# Patient Record
Sex: Male | Born: 1990 | Race: Black or African American | Hispanic: No | Marital: Married | Smoking: Never smoker
Health system: Southern US, Community
[De-identification: ages and names within clinical notes are randomized; demographics above are authoritative.]

---
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
## 2019-10-15 ENCOUNTER — Emergency Department (HOSPITAL_COMMUNITY)
Admission: EM | Admit: 2019-10-15 | Discharge: 2019-10-15 | Disposition: A | Discharge status: Home / Self Care | Payer: BC Managed Care – PPO | Attending: Emergency Medicine | Admitting: Emergency Medicine

## 2019-10-15 ENCOUNTER — Encounter (HOSPITAL_COMMUNITY): Payer: Self-pay | Admitting: *Deleted

## 2019-10-15 ENCOUNTER — Other Ambulatory Visit: Payer: Self-pay

## 2019-10-15 DIAGNOSIS — R0981 Nasal congestion: Secondary | ICD-10-CM | POA: Diagnosis not present

## 2019-10-15 DIAGNOSIS — J029 Acute pharyngitis, unspecified: Secondary | ICD-10-CM | POA: Insufficient documentation

## 2019-10-15 DIAGNOSIS — K219 Gastro-esophageal reflux disease without esophagitis: Secondary | ICD-10-CM | POA: Insufficient documentation

## 2019-10-15 LAB — GROUP A STREP BY PCR: Group A Strep by PCR: NOT DETECTED

## 2019-10-15 MED ORDER — PANTOPRAZOLE SODIUM 40 MG PO TBEC: 40.0000 mg | DELAYED_RELEASE_TABLET | Freq: Every day | ORAL | 0 refills | Status: DC

## 2019-10-15 MED ORDER — LIDOCAINE VISCOUS HCL 2 % MT SOLN
15.0000 mL | Freq: Once | OROMUCOSAL | Status: AC
  Administered 2019-10-15: 15 mL via ORAL
  Filled 2019-10-15: qty 15

## 2019-10-15 MED ORDER — ALUM & MAG HYDROXIDE-SIMETH 200-200-20 MG/5ML PO SUSP
30.0000 mL | Freq: Once | ORAL | Status: AC
  Administered 2019-10-15: 30 mL via ORAL
  Filled 2019-10-15: qty 30

## 2019-10-15 NOTE — Discharge Instructions (Addendum)
Your strep test today was negative.  Your throat symptoms may likely be related to acid reflux.  Avoid greasy spicy foods and alcohol.  Take the medication as directed every day.  I have listed a primary care provider for you to arrange follow-up with if needed.

## 2019-10-15 NOTE — ED Triage Notes (Signed)
Pt with sore throat x 4 days. Hurts to swallow per pt.

## 2019-10-15 NOTE — ED Provider Notes (Signed)
[facility] EMERGENCY DEPARTMENT Provider Note   CSN: [id number] Arrival date & time: 10/15/19  1337     History Chief Complaint  Patient presents with  . Sore Throat    Hunter Bell is a 29 y.o. male.  HPI      Hunter Bell is a 29 y.o. male who presents to the Emergency Department complaining of sore throat pain for 4 days.  Pain is associated with swallowing  and worse upon waking.  He describes a sharp pain to his throat when he attempts to swallow even when swallowing his saliva.  States he has been unable to eat solid foods due to the pain. Tolerating fluids.  He states he has had this symptoms before when he has had a "sinus infection" but pain is persisted longer than usual.  He denies any nasal congestion, facial pain or pressure, fever or chills cough or shortness of breath.  Denies known Covid exposures or recent sick contacts.   History reviewed. No pertinent past medical history.  There are no problems to display for this patient.   History reviewed. No pertinent surgical history.     History reviewed. No pertinent family history.  Social History   Tobacco Use  . Smoking status: Never Smoker  . Smokeless tobacco: Never Used  Substance Use Topics  . Alcohol use: Yes    Comment: occ. use  . Drug use: Never    Home Medications Prior to Admission medications   Not on File    Allergies    Patient has no known allergies.  Review of Systems   Review of Systems  Constitutional: Negative for activity change, appetite change, chills and fever.  HENT: Positive for congestion and sore throat. Negative for ear pain, facial swelling, rhinorrhea, sinus pressure, sinus pain, trouble swallowing and voice change.   Eyes: Negative for pain and visual disturbance.  Respiratory: Negative for cough and shortness of breath.   Cardiovascular: Negative for chest pain.  Gastrointestinal: Negative for abdominal pain, diarrhea, nausea and vomiting.  Musculoskeletal:  Negative for arthralgias, neck pain and neck stiffness.  Skin: Negative for color change and rash.  Neurological: Negative for dizziness, facial asymmetry, speech difficulty, numbness and headaches.  Hematological: Negative for adenopathy.    Physical Exam Updated Vital Signs BP (!) 147/95 (BP Location: Right Arm)   Pulse 97   Temp 99.1 F (37.3 C) (Oral)   Resp 14   Ht 6\' 1"  (1.854 m)   Wt 106.6 kg   SpO2 95%   BMI 31.00 kg/m   Physical Exam Vitals and nursing note reviewed.  Constitutional:      Appearance: He is well-developed. He is not ill-appearing or toxic-appearing.  HENT:     Right Ear: Tympanic membrane and ear canal normal.     Left Ear: Tympanic membrane and ear canal normal.     Mouth/Throat:     Mouth: Mucous membranes are moist. No oral lesions.     Pharynx: Oropharynx is clear. Uvula midline. No pharyngeal swelling, oropharyngeal exudate, posterior oropharyngeal erythema or uvula swelling.     Comments: No erythema or edema of the oropharynx.  Uvula is midline and nonedematous.  No oral lesions.  No tonsillar exudates.  No peritonsillar abscess. Cardiovascular:     Rate and Rhythm: Normal rate and regular rhythm.  Pulmonary:     Effort: Pulmonary effort is normal.     Breath sounds: Normal breath sounds.  Abdominal:     General: There is no distension.  Palpations: Abdomen is soft.     Tenderness: There is no abdominal tenderness.  Musculoskeletal:     Cervical back: Normal range of motion.  Lymphadenopathy:     Cervical: No cervical adenopathy.  Skin:    General: Skin is warm.     Capillary Refill: Capillary refill takes less than 2 seconds.  Neurological:     Mental Status: He is alert.     ED Results / Procedures / Treatments   Labs (all labs ordered are listed, but only abnormal results are displayed) Labs Reviewed  GROUP A STREP BY PCR    EKG None  Radiology No results found.  Procedures Procedures (including critical care  time)  Medications Ordered in ED Medications  alum & mag hydroxide-simeth (MAALOX/MYLANTA) 200-200-20 MG/5ML suspension 30 mL (has no administration in time range)    And  lidocaine (XYLOCAINE) 2 % viscous mouth solution 15 mL (has no administration in time range)    ED Course  I have reviewed the triage vital signs and the nursing notes.  Pertinent labs & imaging results that were available during my care of the patient were reviewed by me and considered in my medical decision making (see chart for details).    MDM Rules/Calculators/A&P                          Patient here with 4-day history of sore throat.  Describes a sensation of swallowing needles.  Airways patent.  Oropharynx is normal-appearing.  No tonsillar exudates.  He is handling secretions well and no hot potato voice.  Strep PCR is negative.  No concerning symptoms for peritonsillar or retropharyngeal abscess.  I feel that symptoms are likely related to acid reflux.  I will start him on a PPI, he agrees to close outpatient follow-up.   Final Clinical Impression(s) / ED Diagnoses Final diagnoses:  Sore throat  Gastroesophageal reflux disease without esophagitis    Rx / DC Orders ED Discharge Orders    None       Pauline Aus, PA-C 10/15/19 1559    Bethann Berkshire, MD 10/16/19 [phone]

## 2020-06-24 ENCOUNTER — Encounter (HOSPITAL_COMMUNITY): Payer: Self-pay | Admitting: Emergency Medicine

## 2020-06-24 ENCOUNTER — Other Ambulatory Visit: Payer: Self-pay

## 2020-06-24 DIAGNOSIS — R7401 Elevation of levels of liver transaminase levels: Secondary | ICD-10-CM | POA: Insufficient documentation

## 2020-06-24 DIAGNOSIS — R202 Paresthesia of skin: Secondary | ICD-10-CM | POA: Insufficient documentation

## 2020-06-24 NOTE — ED Triage Notes (Signed)
Pt to the ED with pins and needle feeling in his left arm, legs and chest for the past 7 days. Pt states it is starting to affect his right arm today.

## 2020-06-25 ENCOUNTER — Emergency Department (HOSPITAL_COMMUNITY): Payer: Self-pay

## 2020-06-25 ENCOUNTER — Encounter (HOSPITAL_COMMUNITY): Payer: Self-pay | Admitting: Radiology

## 2020-06-25 ENCOUNTER — Emergency Department (HOSPITAL_COMMUNITY)
Admission: EM | Admit: 2020-06-25 | Discharge: 2020-06-25 | Disposition: A | Discharge status: Home / Self Care | Payer: Self-pay | Attending: Emergency Medicine | Admitting: Emergency Medicine

## 2020-06-25 DIAGNOSIS — R7401 Elevation of levels of liver transaminase levels: Secondary | ICD-10-CM

## 2020-06-25 DIAGNOSIS — R202 Paresthesia of skin: Secondary | ICD-10-CM

## 2020-06-25 LAB — CBC WITH DIFFERENTIAL/PLATELET
Abs Immature Granulocytes: 0.02 10*3/uL (ref 0.00–0.07)
Basophils Absolute: 0.1 10*3/uL (ref 0.0–0.1)
Basophils Relative: 1 %
Eosinophils Absolute: 0.2 10*3/uL (ref 0.0–0.5)
Eosinophils Relative: 2 %
HCT: 49.5 % (ref 39.0–52.0)
Hemoglobin: 16.7 g/dL (ref 13.0–17.0)
Immature Granulocytes: 0 %
Lymphocytes Relative: 26 %
Lymphs Abs: 2.5 10*3/uL (ref 0.7–4.0)
MCH: 30.7 pg (ref 26.0–34.0)
MCHC: 33.7 g/dL (ref 30.0–36.0)
MCV: 91 fL (ref 80.0–100.0)
Monocytes Absolute: 1.1 10*3/uL — ABNORMAL HIGH (ref 0.1–1.0)
Monocytes Relative: 11 %
Neutro Abs: 5.8 10*3/uL (ref 1.7–7.7)
Neutrophils Relative %: 60 %
Platelets: 277 10*3/uL (ref 150–400)
RBC: 5.44 MIL/uL (ref 4.22–5.81)
RDW: 13.3 % (ref 11.5–15.5)
WBC: 9.6 10*3/uL (ref 4.0–10.5)
nRBC: 0 % (ref 0.0–0.2)

## 2020-06-25 LAB — SEDIMENTATION RATE: Sed Rate: 2 mm/hr (ref 0–16)

## 2020-06-25 LAB — COMPREHENSIVE METABOLIC PANEL
ALT: 66 U/L — ABNORMAL HIGH (ref 0–44)
AST: 34 U/L (ref 15–41)
Albumin: 4.2 g/dL (ref 3.5–5.0)
Alkaline Phosphatase: 75 U/L (ref 38–126)
Anion gap: 9 (ref 5–15)
BUN: 14 mg/dL (ref 6–20)
CO2: 23 mmol/L (ref 22–32)
Calcium: 9.2 mg/dL (ref 8.9–10.3)
Chloride: 105 mmol/L (ref 98–111)
Creatinine, Ser: 1.04 mg/dL (ref 0.61–1.24)
GFR, Estimated: >60 mL/min (ref >60)
Glucose, Bld: 104 mg/dL — ABNORMAL HIGH (ref 70–99)
Potassium: 3.8 mmol/L (ref 3.5–5.1)
Sodium: 137 mmol/L (ref 135–145)
Total Bilirubin: 0.4 mg/dL (ref 0.3–1.2)
Total Protein: 7.9 g/dL (ref 6.5–8.1)

## 2020-06-25 IMAGING — CT CT HEAD W/O CM
3 series · 16 of 47 positions shown, 19 images · non-contrast
Comparison: None.

CLINICAL DATA: New onset left-sided numbness

EXAM:
CT HEAD WITHOUT CONTRAST
TECHNIQUE: Contiguous axial images were obtained from the base of the skull
through the vertex without intravenous contrast.

[Series 2: head w o · axial · 0.43mm/px · z∈[+1345,+1475]mm · 10 of 32 slices shown, 13 images]
[im 3/32  brain]
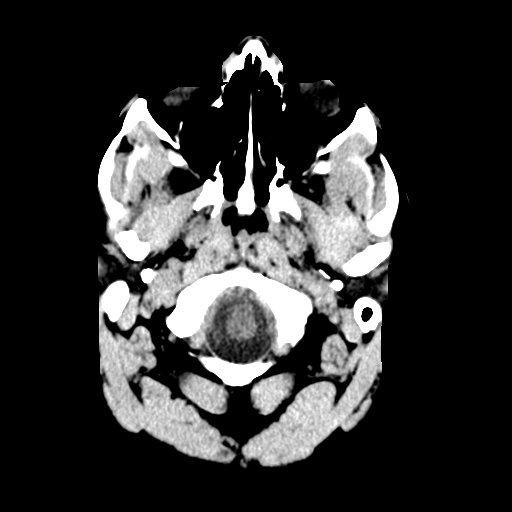
[im 3/32  bone]
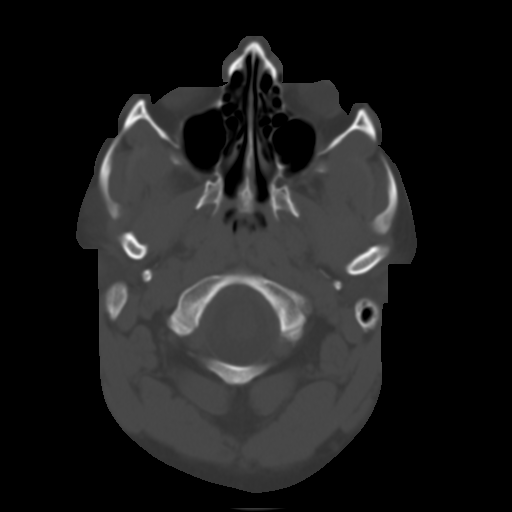
[im 6/32  brain]
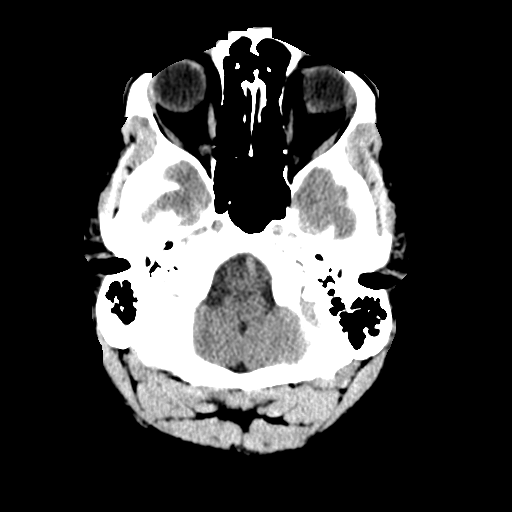
[im 9/32  brain]
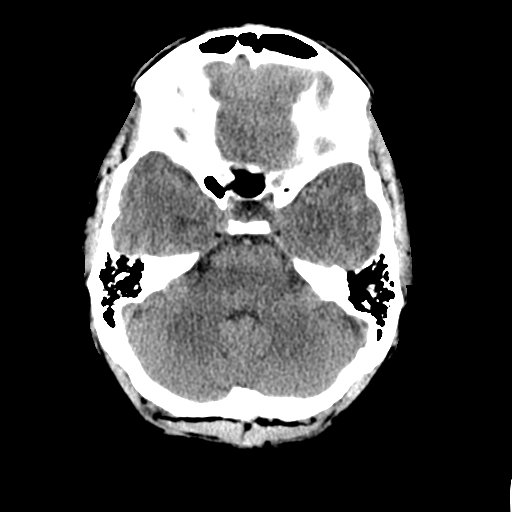
[im 11/32  brain]
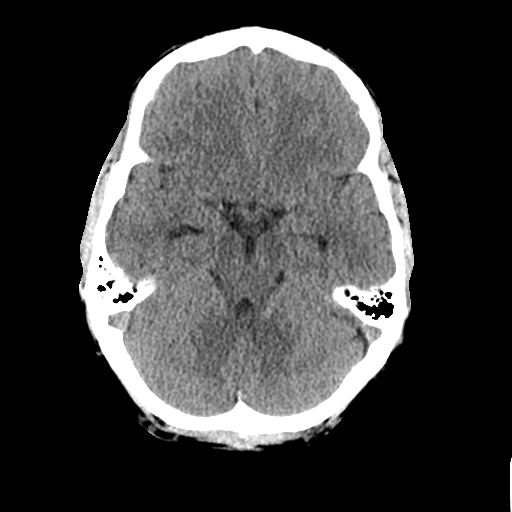
[im 14/32  brain]
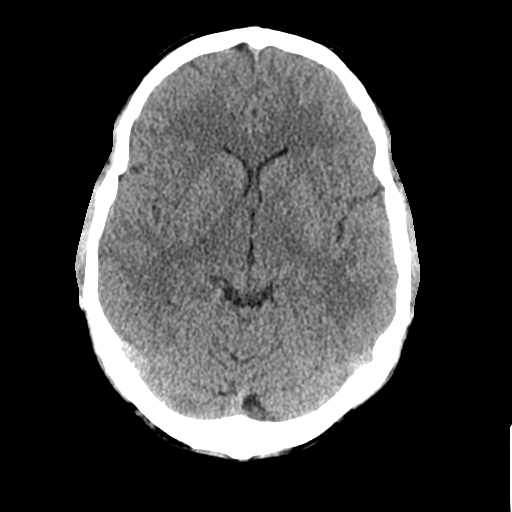
[im 14/32  bone]
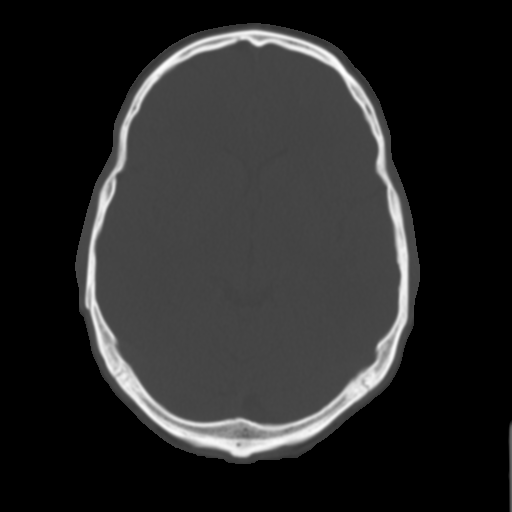
[im 18/32  brain]
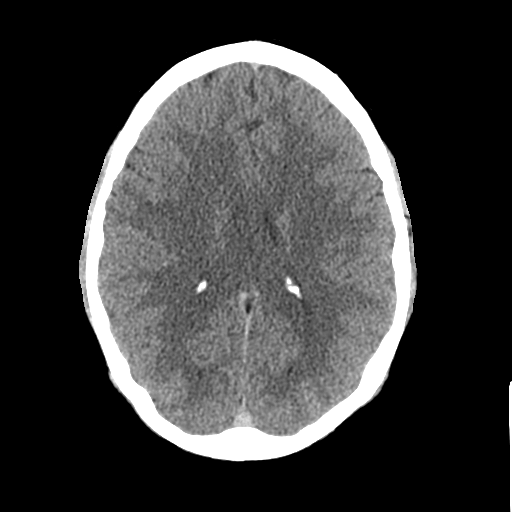
[im 21/32  brain]
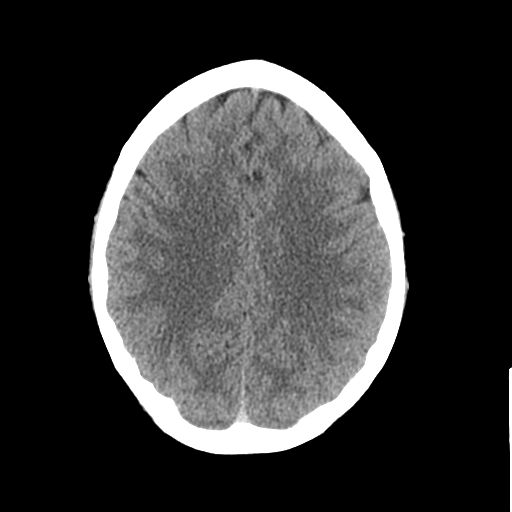
[im 24/32  brain]
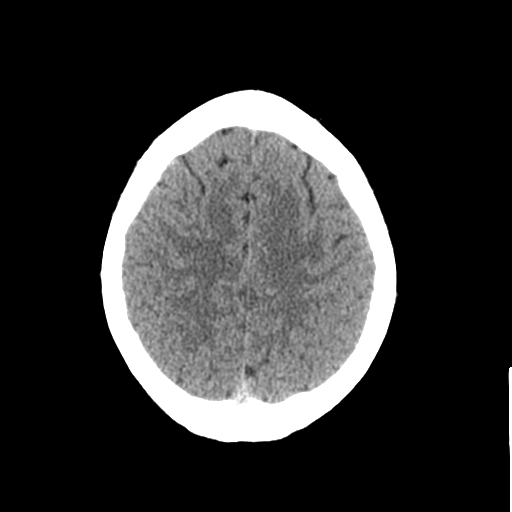
[im 26/32  brain]
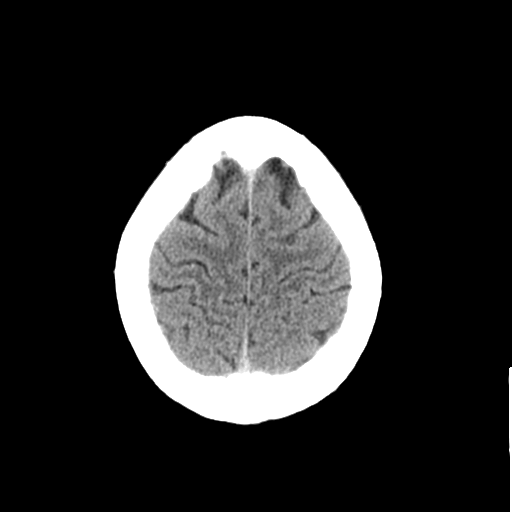
[im 26/32  bone]
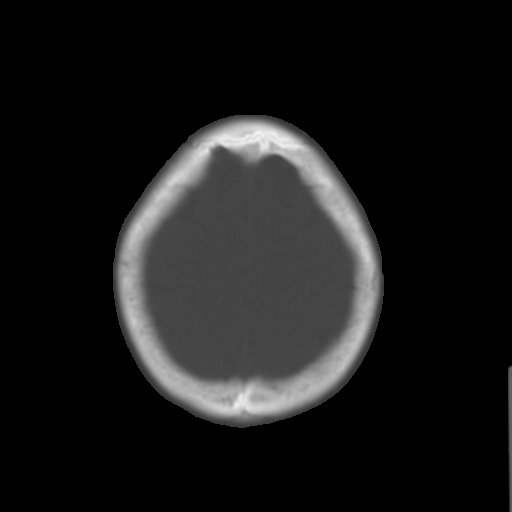
[im 29/32  brain]
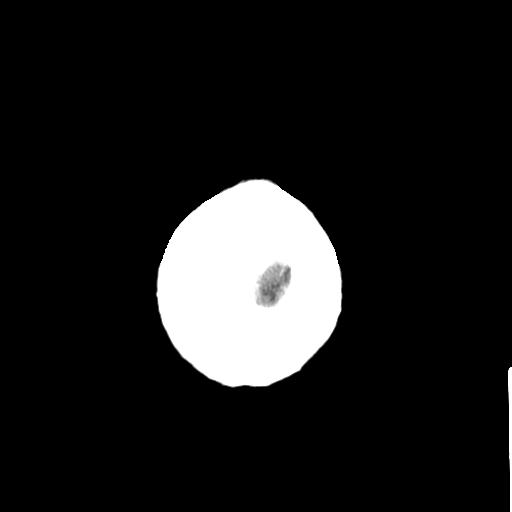

[Series 4: coronal soft · coronal · 0.32mm/px · 3 of 67 slices shown]
[im 23/67  brain]
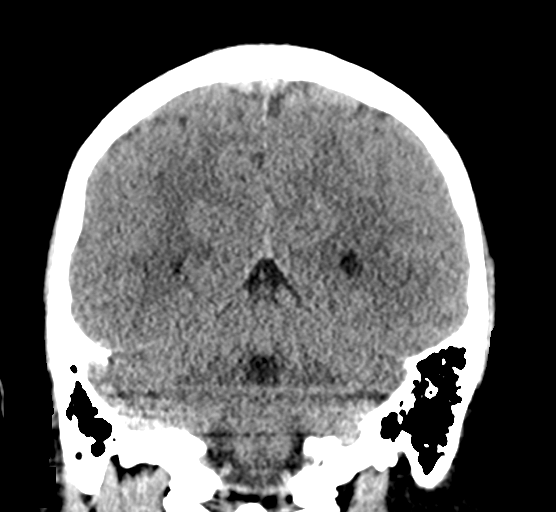
[im 30/67  brain]
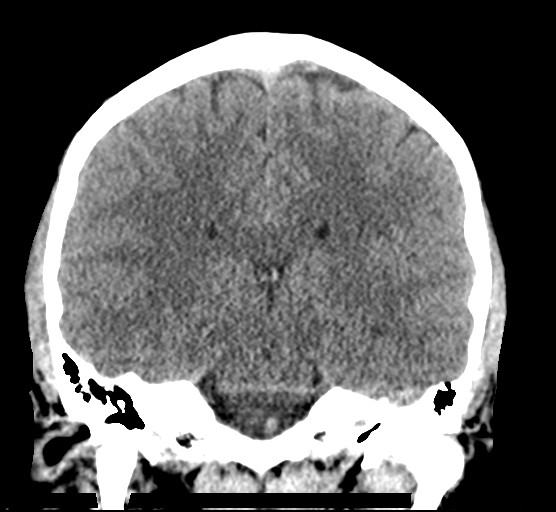
[im 37/67  brain]
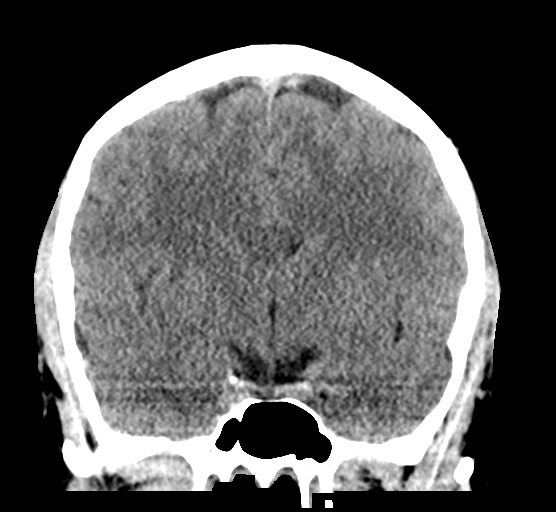

[Series 5: sagittal soft · sagittal · 0.33mm/px · 3 of 57 slices shown]
[im 19/57  brain]
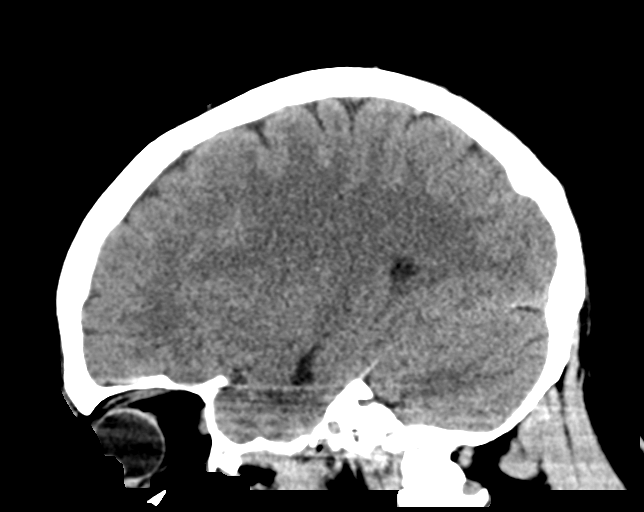
[im 29/57  brain]
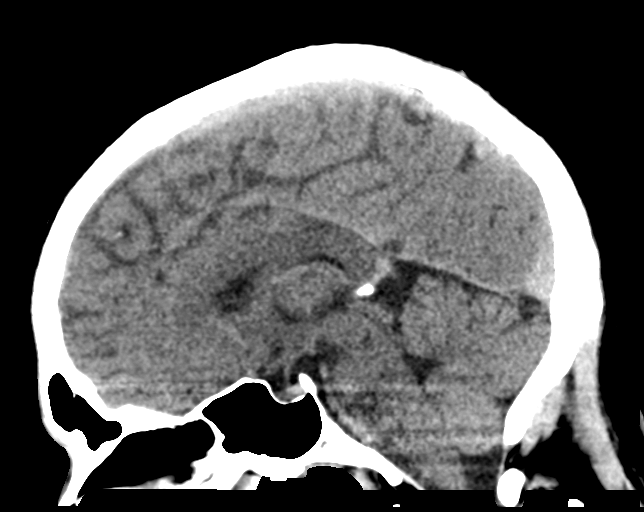
[im 38/57  brain]
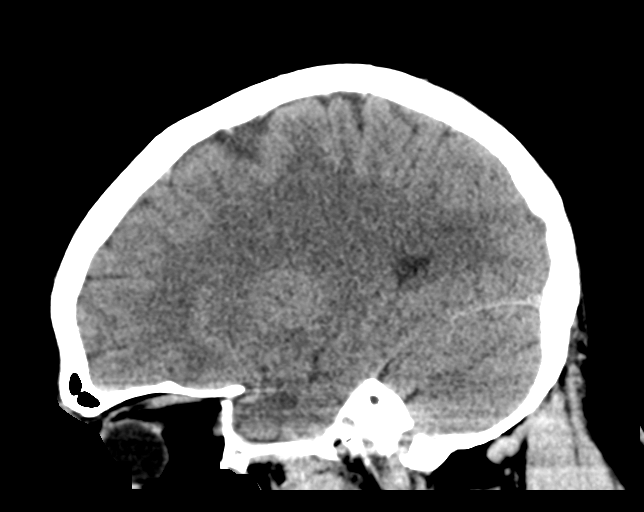

[16 of 47 positions shown; findings below may reference images not displayed]

FINDINGS: Brain: There is no mass, hemorrhage or extra-axial collection. The
size and configuration of the ventricles and extra-axial CSF spaces
are normal. The brain parenchyma is normal, without acute or chronic
infarction.

Vascular: No abnormal hyperdensity of the major intracranial
arteries or dural venous sinuses. No intracranial atherosclerosis.

Skull: The visualized skull base, calvarium and extracranial soft
tissues are normal.

Sinuses/Orbits: No fluid levels or advanced mucosal thickening of
the visualized paranasal sinuses. No mastoid or middle ear effusion.
The orbits are normal.
IMPRESSION: Normal head CT.

## 2020-06-25 NOTE — Discharge Instructions (Addendum)
The cause for your numb feeling was not found through either of the blood tests or the CT scan which was done today.  You need to be evaluated by a neurologist, and will likely need MRI scans of your head and cervical spine.  Please call the neurologist today to get an appointment as soon as possible.  If your symptoms are getting worse, return at any time.

## 2020-06-25 NOTE — ED Provider Notes (Signed)
[facility] EMERGENCY DEPARTMENT Provider Note   CSN: [id number] Arrival date & time: 06/24/20  2142   History Chief Complaint  Patient presents with  . Extremity neuropathy    Hunter Bell is a 30 y.o. male.  The history is provided by the patient.  He comes in with approximately 10-day history of progressive paresthesias.  It started off and is left arm and then gradually spread to involve the left side of his trunk, left leg, then the right side of the trunk and then right arm and right leg.  He states it feels like his arms and legs are asleep.  He can feel that something is touching them but cannot tell what it is.  He has not noted any weakness.  There is no numbness of the face or neck.  He denies any bowel or bladder dysfunction.  He states that when he has to urinate he can tell that he has to but he cannot feel that he is actually passing the urine.  He denies fever chills.  He has not noted any difficulty with coordination.  He drinks occasionally, denies illicit drug use.  History reviewed. No pertinent past medical history.  There are no problems to display for this patient.   History reviewed. No pertinent surgical history.     History reviewed. No pertinent family history.  Social History   Tobacco Use  . Smoking status: Never Smoker  . Smokeless tobacco: Never Used  Vaping Use  . Vaping Use: Never used  Substance Use Topics  . Alcohol use: Yes    Comment: occ. use  . Drug use: Never    Home Medications Prior to Admission medications   Medication Sig Start Date End Date Taking? Authorizing Provider  pantoprazole (PROTONIX) 40 MG tablet Take 1 tablet (40 mg total) by mouth daily. 10/15/19   Triplett, Tammy, PA-C    Allergies    Patient has no known allergies.  Review of Systems   Review of Systems  All other systems reviewed and are negative.   Physical Exam Updated Vital Signs BP (!) 142/79 (BP Location: Right Arm)   Pulse 74   Temp 98.1 F  (36.7 C) (Oral)   Resp 16   Ht 6\' 1"  (1.854 m)   Wt 111.1 kg   SpO2 98%   BMI 32.32 kg/m   Physical Exam Vitals and nursing note reviewed.   30 year old male, resting comfortably and in no acute distress. Vital signs are significant for borderline elevated blood pressure. Oxygen saturation is 98%, which is normal. Head is normocephalic and atraumatic. PERRLA, EOMI. Oropharynx is clear. Neck is nontender and supple without adenopathy or JVD. Back is nontender and there is no CVA tenderness. Lungs are clear without rales, wheezes, or rhonchi. Chest is nontender. Heart has regular rate and rhythm without murmur. Abdomen is soft, flat, nontender without masses or hepatosplenomegaly and peristalsis is normoactive. Extremities have no cyanosis or edema, full range of motion is present. Skin is warm and dry without rash. Neurologic: Awake and alert, speech is normal, cranial nerves are intact.  Motor strength is 5/5 in both arms and both legs.  There is no pronator drift.  Sensation is mildly decreased in the right arm, moderately decreased in the right leg, severely decreased in the left arm and left leg.  There is mild ataxia noted on finger-nose testing on the left and also with heel-to-shin on the left.  ED Results / Procedures / Treatments   Labs (  all labs ordered are listed, but only abnormal results are displayed) Labs Reviewed  COMPREHENSIVE METABOLIC PANEL - Abnormal; Notable for the following components:      Result Value   Glucose, Bld 104 (*)    ALT 66 (*)    All other components within normal limits  CBC WITH DIFFERENTIAL/PLATELET - Abnormal; Notable for the following components:   Monocytes Absolute 1.1 (*)    All other components within normal limits  SEDIMENTATION RATE   Radiology CT Head Wo Contrast  Result Date: 06/25/2020 CLINICAL DATA:  New onset left-sided numbness EXAM: CT HEAD WITHOUT CONTRAST TECHNIQUE: Contiguous axial images were obtained from the base of  the skull through the vertex without intravenous contrast. COMPARISON:  None. FINDINGS: Brain: There is no mass, hemorrhage or extra-axial collection. The size and configuration of the ventricles and extra-axial CSF spaces are normal. The brain parenchyma is normal, without acute or chronic infarction. Vascular: No abnormal hyperdensity of the major intracranial arteries or dural venous sinuses. No intracranial atherosclerosis. Skull: The visualized skull base, calvarium and extracranial soft tissues are normal. Sinuses/Orbits: No fluid levels or advanced mucosal thickening of the visualized paranasal sinuses. No mastoid or middle ear effusion. The orbits are normal. IMPRESSION: Normal head CT. Electronically Signed   By: Deatra Robinson M.D.   On: 06/25/2020 03:13    Procedures Procedures   Medications Ordered in ED Medications - No data to display  ED Course  I have reviewed the triage vital signs and the nursing notes.  Pertinent labs & imaging results that were available during my care of the patient were reviewed by me and considered in my medical decision making (see chart for details).  MDM Rules/Calculators/A&P Paresthesias which are not following any typical neurologic pattern.  This is certainly concerning for multiple sclerosis.  Will check screening labs and CT of head.  I suspect that he will need MRI scans of the head and cervical spine.  Old records are reviewed, and he has no relevant past visits.  CT is normal.  Labs are significant for mildly elevated ALT of doubtful clinical significance.  Sedimentation rate is 2.  Patient was offered the option of staying to have MRI of brain and cervical spine done here, he prefers to go home.  He is referred to neurology for outpatient work-up.  Advised to return if his symptoms are getting worse.  Final Clinical Impression(s) / ED Diagnoses Final diagnoses:  Paresthesia  Elevated ALT measurement    Rx / DC Orders ED Discharge Orders     None       Dione Booze, MD 06/25/20 [phone]

## 2020-06-26 ENCOUNTER — Observation Stay (HOSPITAL_COMMUNITY)
Admission: EM | Admit: 2020-06-26 | Discharge: 2020-06-29 | Disposition: A | Discharge status: Home / Self Care | Payer: Self-pay | Attending: Internal Medicine | Admitting: Internal Medicine

## 2020-06-26 ENCOUNTER — Other Ambulatory Visit: Payer: Self-pay

## 2020-06-26 DIAGNOSIS — G379 Demyelinating disease of central nervous system, unspecified: Secondary | ICD-10-CM

## 2020-06-26 DIAGNOSIS — M6281 Muscle weakness (generalized): Secondary | ICD-10-CM | POA: Diagnosis present

## 2020-06-26 DIAGNOSIS — R2990 Unspecified symptoms and signs involving the nervous system: Secondary | ICD-10-CM

## 2020-06-26 DIAGNOSIS — E538 Deficiency of other specified B group vitamins: Secondary | ICD-10-CM | POA: Diagnosis present

## 2020-06-26 DIAGNOSIS — Z79899 Other long term (current) drug therapy: Secondary | ICD-10-CM | POA: Insufficient documentation

## 2020-06-26 DIAGNOSIS — G373 Acute transverse myelitis in demyelinating disease of central nervous system: Secondary | ICD-10-CM

## 2020-06-26 DIAGNOSIS — E559 Vitamin D deficiency, unspecified: Secondary | ICD-10-CM | POA: Insufficient documentation

## 2020-06-26 DIAGNOSIS — R202 Paresthesia of skin: Principal | ICD-10-CM | POA: Insufficient documentation

## 2020-06-26 DIAGNOSIS — Z20822 Contact with and (suspected) exposure to covid-19: Secondary | ICD-10-CM | POA: Insufficient documentation

## 2020-06-26 NOTE — ED Provider Notes (Signed)
MSE was initiated and I personally evaluated the patient and placed orders (if any) at  9:54 PM on June 26, 2020.  The patient appears stable so that the remainder of the MSE may be completed by another provider.  Patient is a 30 year old man who is in today for evaluation of 10 days of progressive paresthesias.  He was seen last night at [facility] and at that point declined MRIs. He presents today because he states his symptoms are worsening and he is wishing for MRIs. No trauma.  No pain in his chest, neck or back.   Initiation of care has begun. The patient has been counseled on the process, plan, and necessity for staying for the completion/evaluation, and the remainder of the medical screening examination.     Norman Clay 06/26/20 2156    Gerhard Munch, MD 06/26/20 2326

## 2020-06-26 NOTE — ED Triage Notes (Signed)
Pt reports in arms and legs numbness on left side, Also numbness in private area and buttocks since last Thursday   Pt was seen yesterday at [facility] and was told to come to [facility] for an MRI. NIH= 0

## 2020-06-27 ENCOUNTER — Encounter (HOSPITAL_COMMUNITY): Payer: Self-pay | Admitting: Internal Medicine

## 2020-06-27 ENCOUNTER — Emergency Department (HOSPITAL_COMMUNITY): Payer: Self-pay

## 2020-06-27 ENCOUNTER — Other Ambulatory Visit: Payer: Self-pay

## 2020-06-27 DIAGNOSIS — G959 Disease of spinal cord, unspecified: Secondary | ICD-10-CM

## 2020-06-27 DIAGNOSIS — M6281 Muscle weakness (generalized): Secondary | ICD-10-CM | POA: Diagnosis present

## 2020-06-27 LAB — CBC WITH DIFFERENTIAL/PLATELET
Abs Immature Granulocytes: 0.03 10*3/uL (ref 0.00–0.07)
Basophils Absolute: 0.1 10*3/uL (ref 0.0–0.1)
Basophils Relative: 1 %
Eosinophils Absolute: 0.1 10*3/uL (ref 0.0–0.5)
Eosinophils Relative: 1 %
HCT: 49.9 % (ref 39.0–52.0)
Hemoglobin: 17 g/dL (ref 13.0–17.0)
Immature Granulocytes: 0 %
Lymphocytes Relative: 27 %
Lymphs Abs: 3 10*3/uL (ref 0.7–4.0)
MCH: 30.6 pg (ref 26.0–34.0)
MCHC: 34.1 g/dL (ref 30.0–36.0)
MCV: 89.9 fL (ref 80.0–100.0)
Monocytes Absolute: 0.9 10*3/uL (ref 0.1–1.0)
Monocytes Relative: 8 %
Neutro Abs: 6.7 10*3/uL (ref 1.7–7.7)
Neutrophils Relative %: 63 %
Platelets: 271 10*3/uL (ref 150–400)
RBC: 5.55 MIL/uL (ref 4.22–5.81)
RDW: 13.1 % (ref 11.5–15.5)
WBC: 10.8 10*3/uL — ABNORMAL HIGH (ref 4.0–10.5)
nRBC: 0 % (ref 0.0–0.2)

## 2020-06-27 LAB — RESP PANEL BY RT-PCR (FLU A&B, COVID) ARPGX2
Influenza A by PCR: NEGATIVE
Influenza B by PCR: NEGATIVE
SARS Coronavirus 2 by RT PCR: NEGATIVE

## 2020-06-27 LAB — BASIC METABOLIC PANEL
Anion gap: 7 (ref 5–15)
BUN: 13 mg/dL (ref 6–20)
CO2: 26 mmol/L (ref 22–32)
Calcium: 9.3 mg/dL (ref 8.9–10.3)
Chloride: 104 mmol/L (ref 98–111)
Creatinine, Ser: 1.14 mg/dL (ref 0.61–1.24)
GFR, Estimated: >60 mL/min (ref >60)
Glucose, Bld: 93 mg/dL (ref 70–99)
Potassium: 3.7 mmol/L (ref 3.5–5.1)
Sodium: 137 mmol/L (ref 135–145)

## 2020-06-27 LAB — VITAMIN B12: Vitamin B-12: 158 pg/mL — ABNORMAL LOW (ref 180–914)

## 2020-06-27 LAB — TSH: TSH: 2.646 u[IU]/mL (ref 0.350–4.500)

## 2020-06-27 LAB — RAPID HIV SCREEN (HIV 1/2 AB+AG)
HIV 1/2 Antibodies: NONREACTIVE
HIV-1 P24 Antigen - HIV24: NONREACTIVE

## 2020-06-27 IMAGING — MR MR HEAD WO/W CM
11 of 18 series · 21 of 48 positions shown · IV contrast (gadavist)
Comparison: None.

CLINICAL DATA: Multiple sclerosis suspected.

EXAM:
MRI HEAD WITHOUT AND WITH CONTRAST
TECHNIQUE: Multiplanar, multiecho pulse sequences of the brain and surrounding
structures were obtained without and with intravenous contrast.
CONTRAST:  10mL GADAVIST GADOBUTROL 1 MMOL/ML IV SOLN

[Series 2: DWI · axial · 3.6mm · 0.94mm/px · z∈[-52,+100]mm · 3 of 86 slices shown (1 of 2)]
[im 1/86]
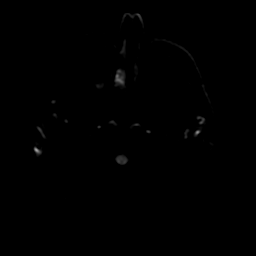
[im 43/86]
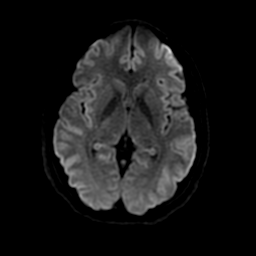
[im 86/86]
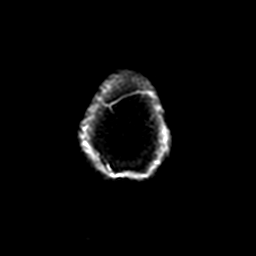

[Series 3: FLAIR · sagittal · 5.0mm · 0.47mm/px · 1 of 25 slices shown (1 of 3)]
[im 1/25]
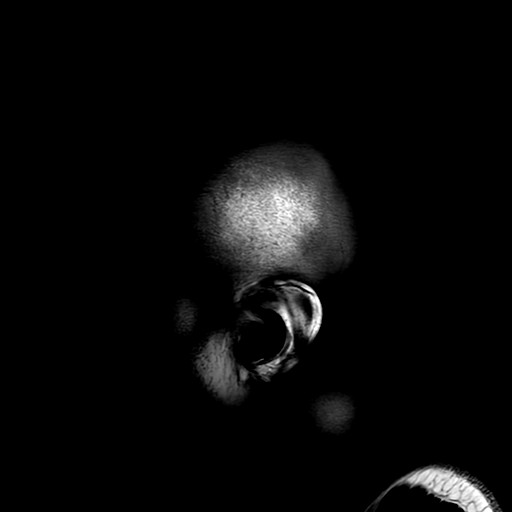

[Series 4: T2 · axial · 5.0mm · 0.23mm/px · 1 of 26 slices shown (1 of 2)]
[im 1/26]
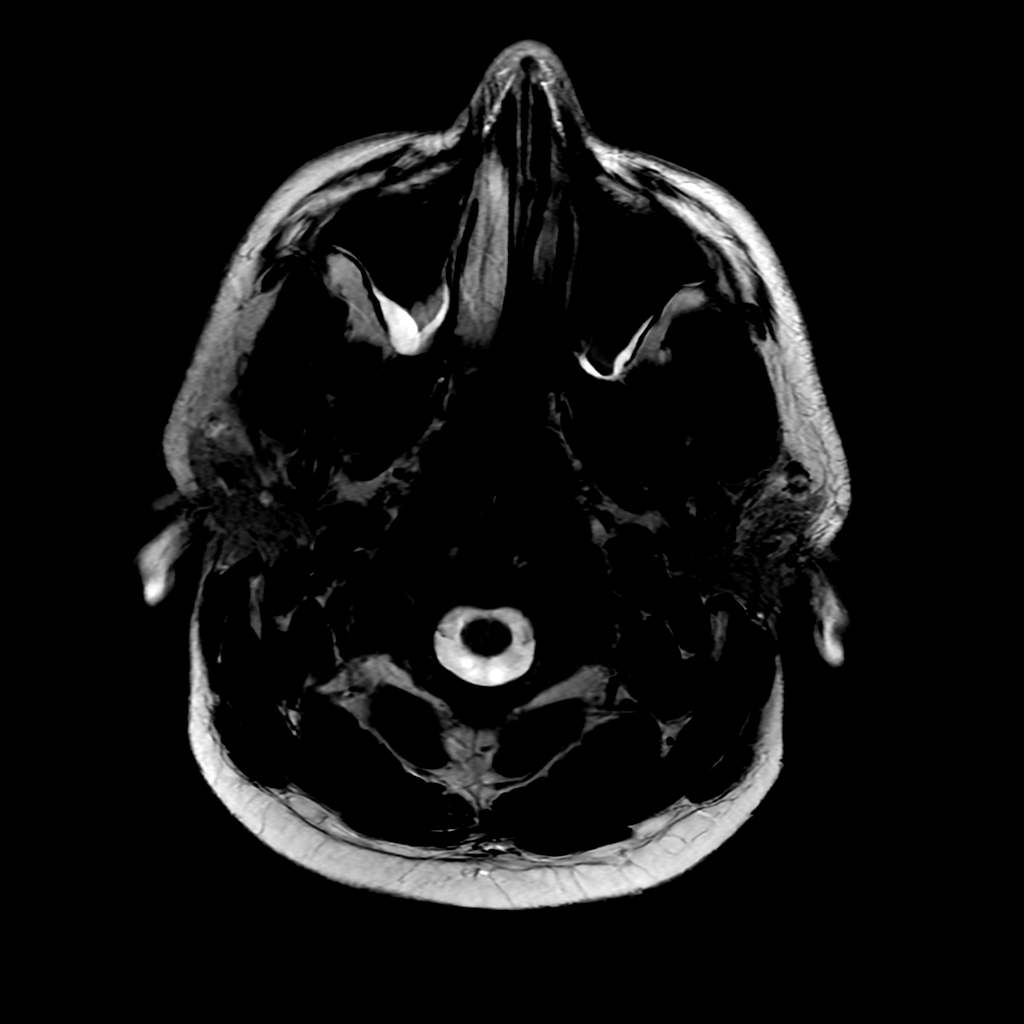

[Series 5: FLAIR · axial · 3.0mm · 0.45mm/px · 1 of 26 slices shown (2 of 3)]
[im 1/26]
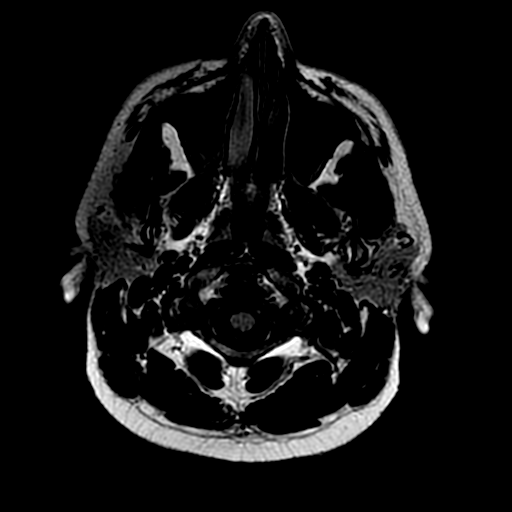

[Series 6: FLAIR · sagittal · 1.6mm · 0.49mm/px · 8 of 240 slices shown (3 of 3)]
[im 1/240]
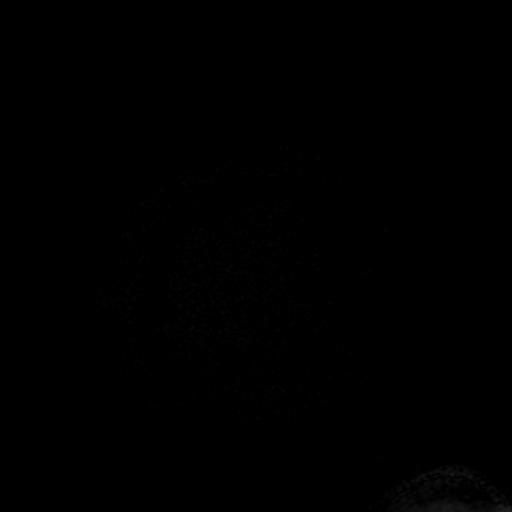
[im 35/240]
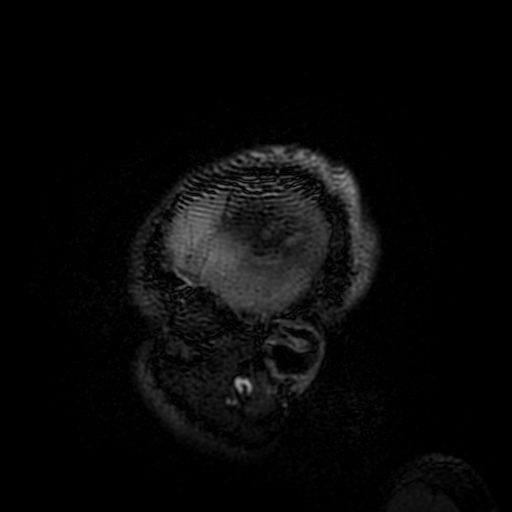
[im 69/240]
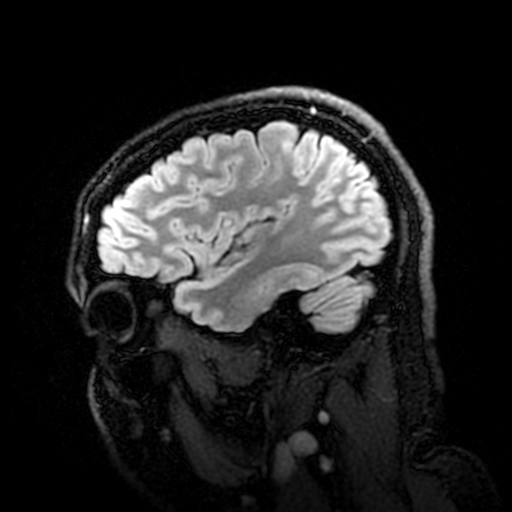
[im 103/240]
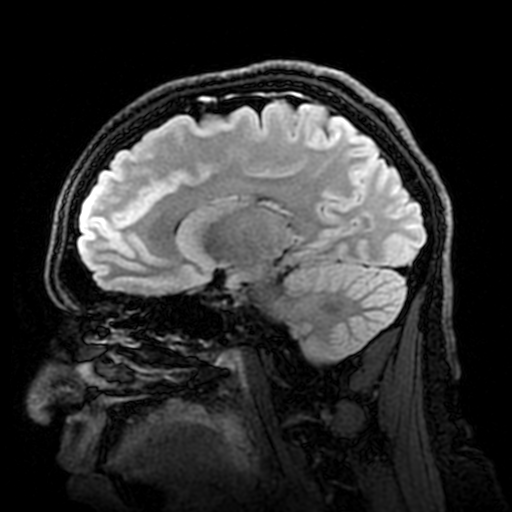
[im 137/240]
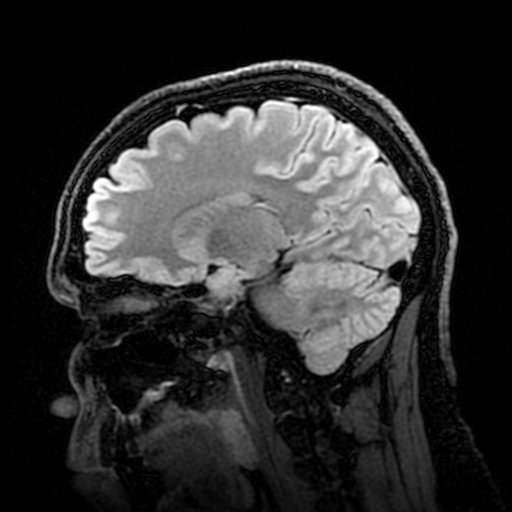
[im 171/240]
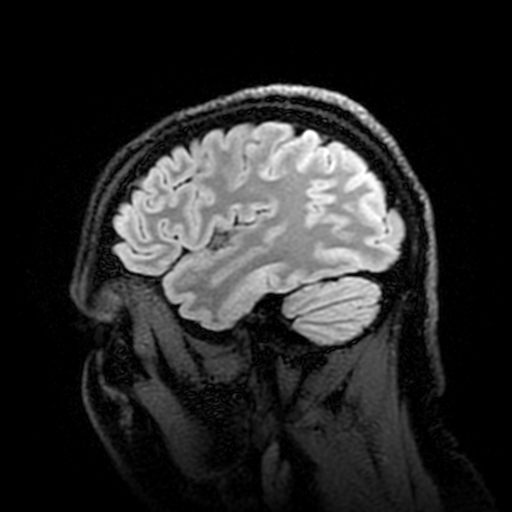
[im 205/240]
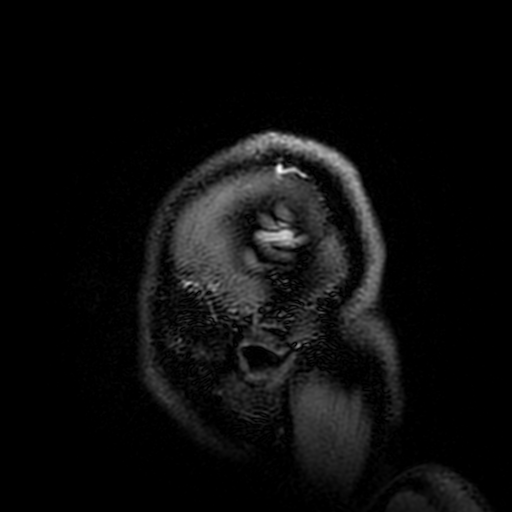
[im 240/240]
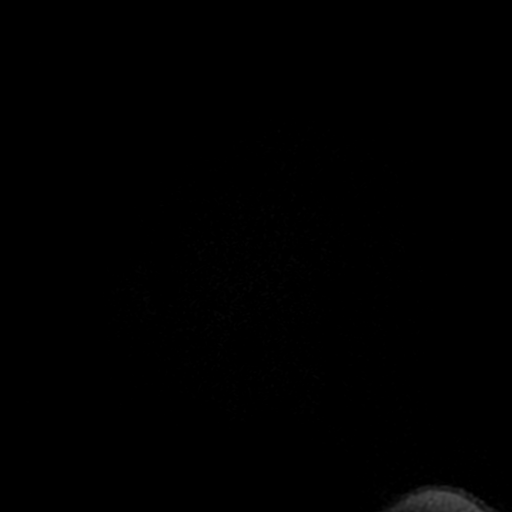

[Series 7: DWI · coronal · 5.0mm · 0.94mm/px · 2 of 72 slices shown (2 of 2)]
[im 1/72]
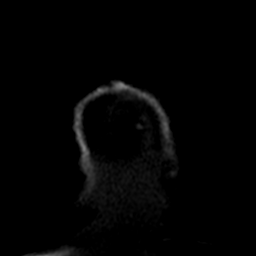
[im 72/72]
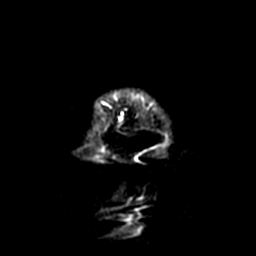

[Series 8: SWI · axial · 3.0mm · 0.47mm/px · 1 of 104 slices shown]
[im 1/104]
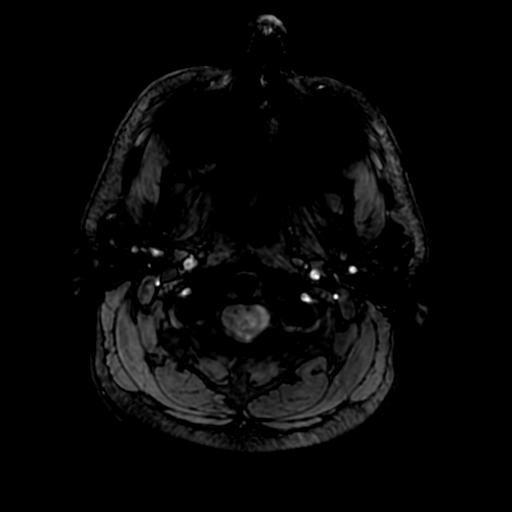

[Series 10: T2 · coronal · 5.0mm · 0.39mm/px · 1 of 30 slices shown (2 of 2)]
[im 1/30]
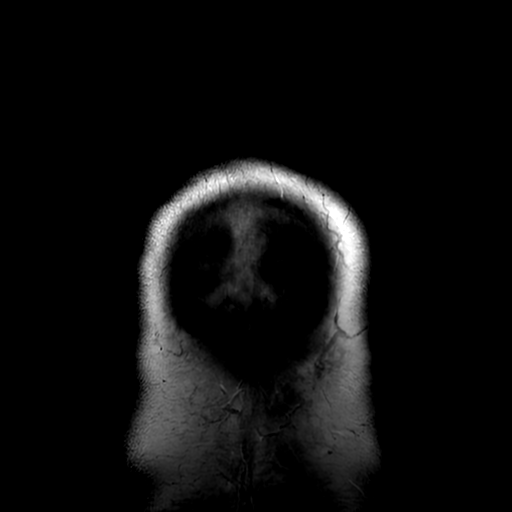

[Series 24: T1 post-contrast · coronal · 5.0mm · 0.43mm/px · 1 of 29 slices shown]
[im 1/29]
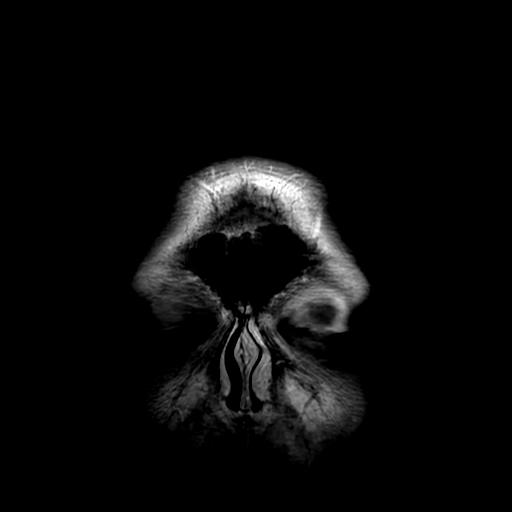

[Series 250: ADC · axial · 3.6mm · 0.94mm/px · 1 of 43 slices shown (1 of 2)]
[im 1/43]
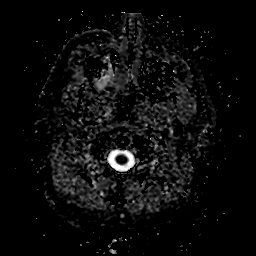

[Series 750: ADC · coronal · 5.0mm · 0.94mm/px · 1 of 36 slices shown (2 of 2)]
[im 1/36]
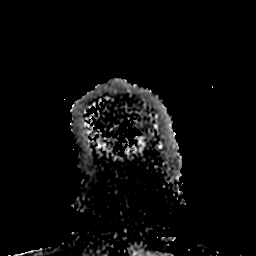

[21 of 48 positions shown; findings below may reference images not displayed]

FINDINGS: Brain: No acute infarction, hemorrhage, hydrocephalus, extra-axial
collection or mass lesion. No visible white matter lesions. No
abnormal enhancement or restricted diffusion.

Vascular: Major arterial flow voids are maintained at the skull
base.

Skull and upper cervical spine: Normal marrow signal. Please see
concurrent MRI of the cervical spine for evaluation of a cord
lesion.

Sinuses/Orbits: Mild inferior maxillary sinuses and ethmoid air cell
mucosal thickening.

Other: No mastoid effusions.
IMPRESSION: 1. No acute intracranial abnormality. No visible white matter
lesions or abnormal enhancement.
2. Please see concurrent MRI of the cervical spine for evaluation of
a cord lesion.

## 2020-06-27 IMAGING — MR MR CERVICAL SPINE WO/W CM
6 of 8 series · 22 of 48 positions shown · IV contrast (gadavist)
Comparison: None.

CLINICAL DATA: Suspicion for multiple sclerosis. Check for lesions.

EXAM:
MRI CERVICAL SPINE WITHOUT AND WITH CONTRAST
TECHNIQUE: Multiplanar and multiecho pulse sequences of the cervical spine, to
include the craniocervical junction and cervicothoracic junction,
were obtained without and with intravenous contrast.
CONTRAST:  10mL GADAVIST GADOBUTROL 1 MMOL/ML IV SOLN

[Series 14: T2 · sagittal · 3.0mm · 0.43mm/px · 2 of 15 slices shown (1 of 2)]
[im 1/15]
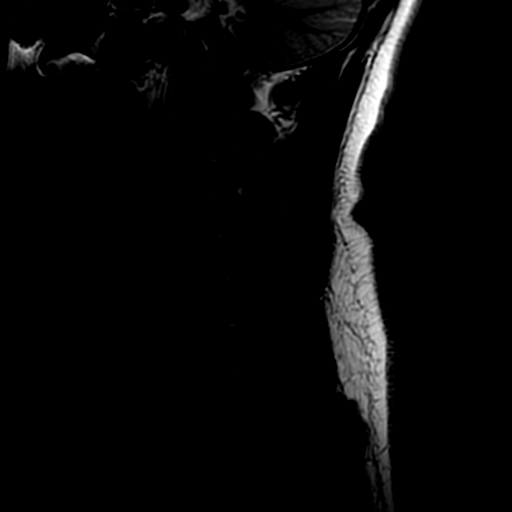
[im 15/15]
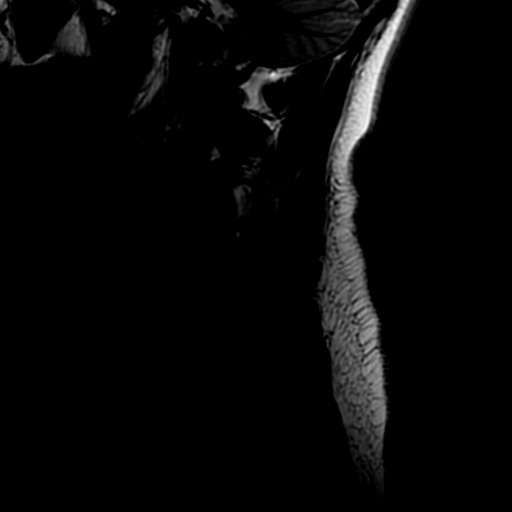

[Series 15: STIR · sagittal · 3.0mm · 0.43mm/px · 3 of 15 slices shown]
[im 1/15]
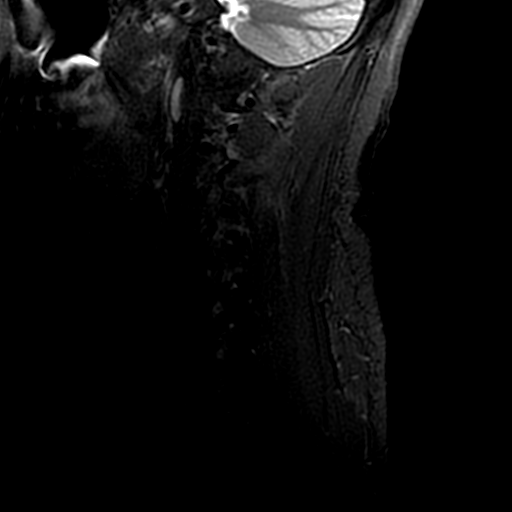
[im 8/15]
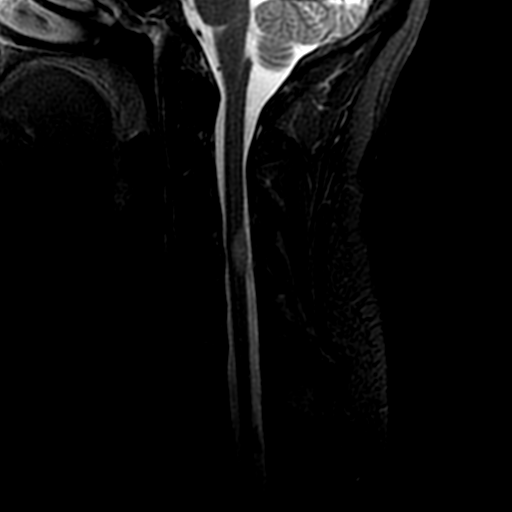
[im 15/15]
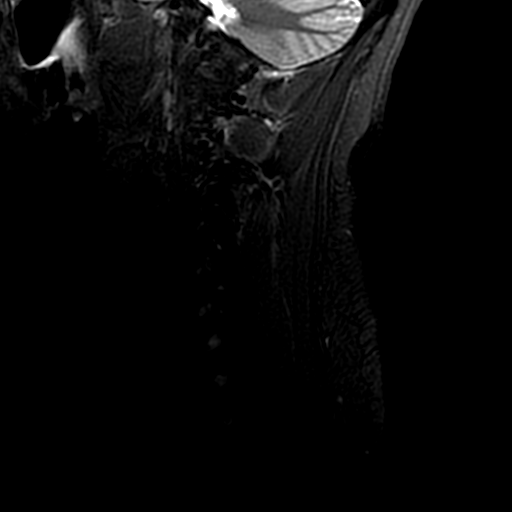

[Series 16: T1 · sagittal · 3.0mm · 0.47mm/px · 3 of 15 slices shown (1 of 2)]
[im 1/15]
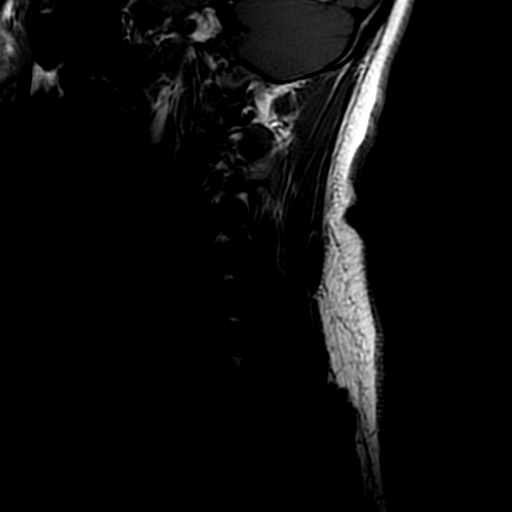
[im 8/15]
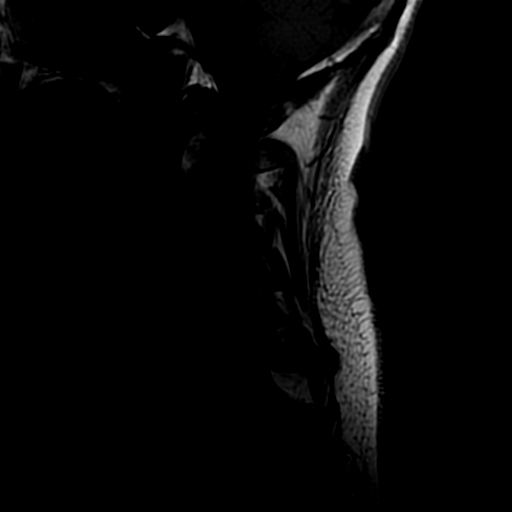
[im 15/15]
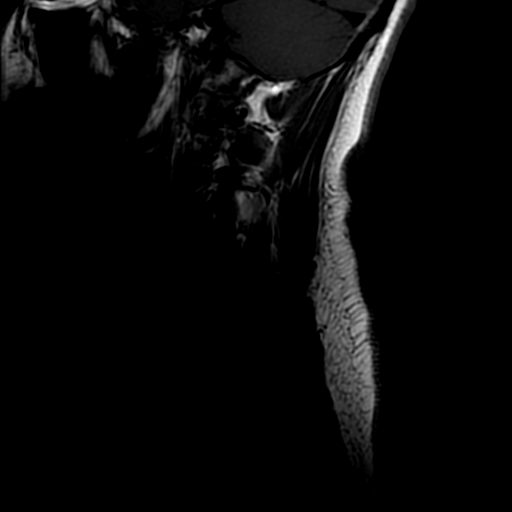

[Series 17: T2 · axial · 3.0mm · 0.35mm/px · z∈[-194,-104]mm · 6 of 26 slices shown (2 of 2)]
[im 1/26]
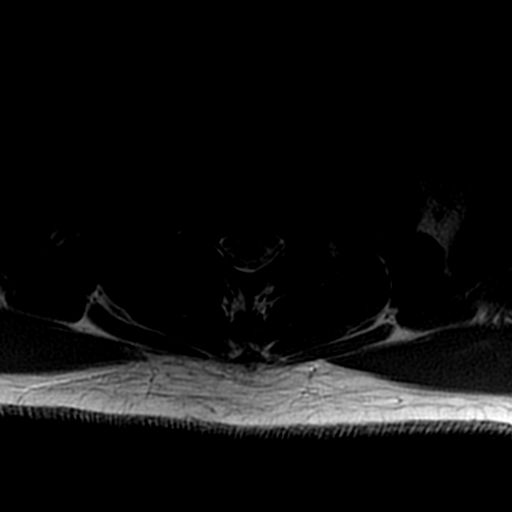
[im 6/26]
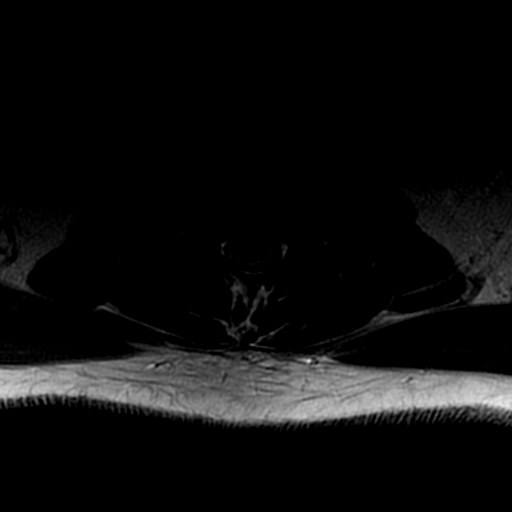
[im 11/26]
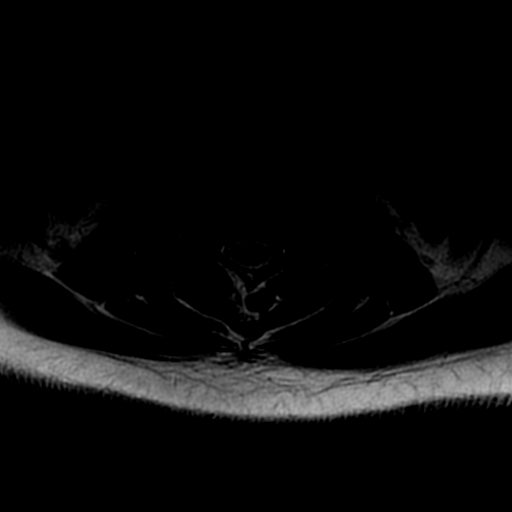
[im 16/26]
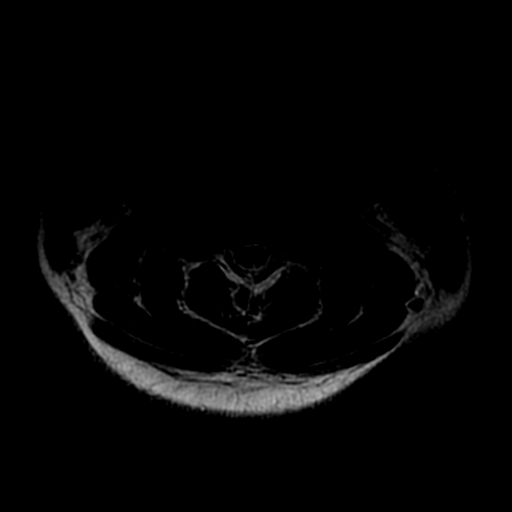
[im 21/26]
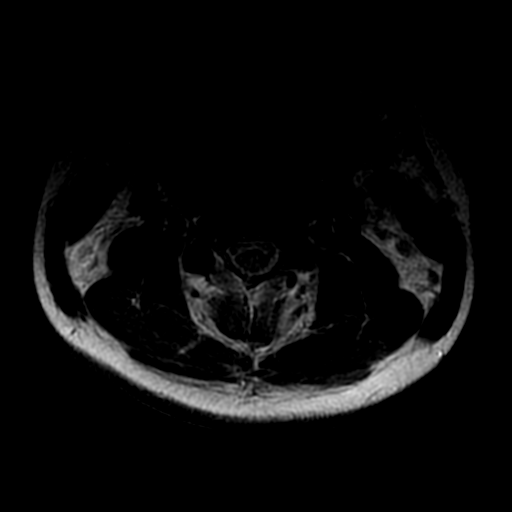
[im 26/26]
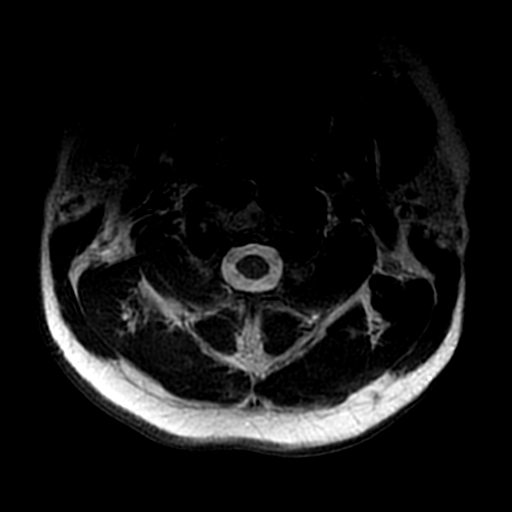

[Series 18: T1 · axial · 3.0mm · 0.70mm/px · z∈[-194,-104]mm · 6 of 26 slices shown (2 of 2)]
[im 1/26]
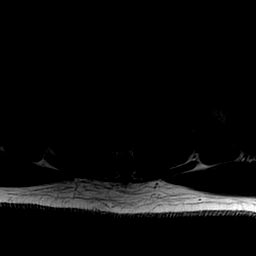
[im 6/26]
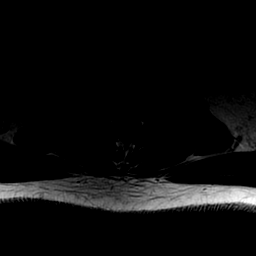
[im 11/26]
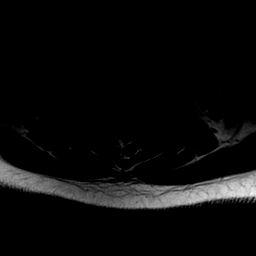
[im 16/26]
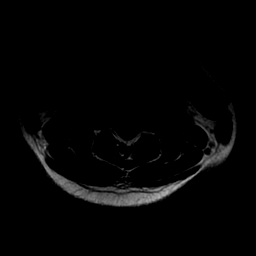
[im 21/26]
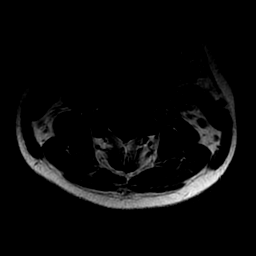
[im 26/26]
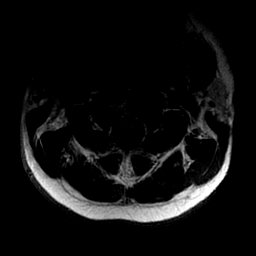

[Series 20: T1 post-contrast · sagittal · 3.0mm · 0.47mm/px · 2 of 15 slices shown]
[im 1/15]
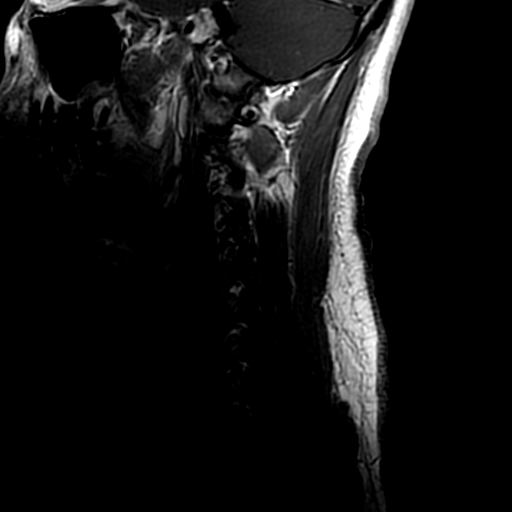
[im 8/15]
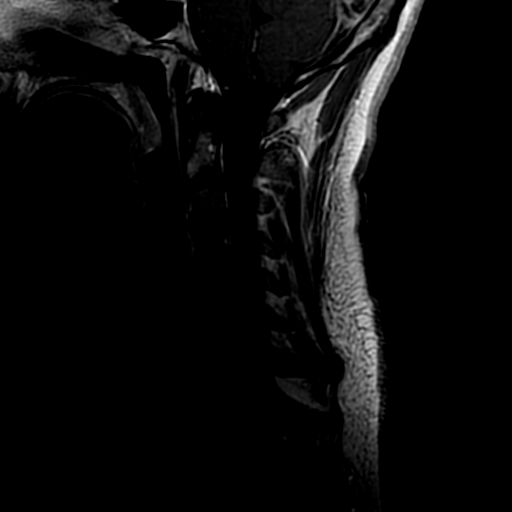

[22 of 48 positions shown; findings below may reference images not displayed]

FINDINGS: Motion limited evaluation.

Alignment: Straightening of the normal cervical lordosis. No
substantial sagittal subluxation.

Vertebrae: Vertebral body heights are maintained. No specific
evidence of acute fracture, discitis/osteomyelitis, or suspicious
bone lesion.

Cord: At C4-C5 there is a short segment area of T2/stir
hyperintensity within the midline dorsal cord which demonstrates
irregular enhancement. This area is mildly expanded.

Posterior Fossa, vertebral arteries, paraspinal tissues: Posterior
fossa is further characterized on concurrent MRI head. Vertebral
artery flow voids are maintained.

Disc levels:

C2-C3: Mild uncovertebral hypertrophy without evidence of
significant canal or foraminal stenosis.

C3-C4: Motion limited evaluation no significant canal stenosis.
Bilateral uncovertebral hypertrophy with possible mild bilateral
foraminal stenosis.

C4-C5: Motion limited evaluation. Bilateral uncovertebral
hypertrophy without significant canal stenosis. Possible mild left
foraminal stenosis.

C5-C6: No significant disc protrusion, foraminal stenosis, or canal
stenosis.

C6-C7: No significant disc protrusion, foraminal stenosis, or canal
stenosis.

C7-T1: No significant disc protrusion, foraminal stenosis, or canal
stenosis.
IMPRESSION: 1. Mildly expansile short-segment T2/STIR hyperintensity within the
dorsal cord at C4-C5 with irregular enhancement. Findings are most
consistent with active demyelination given the characteristic
appearance and the provided clinical history. Infection, transverse
myelitis, or tumor are less likely differential considerations.
2. Motion limited evaluation with possibly mild foraminal stenosis
bilaterally at C3-C4 and on the left at C4-C5. No significant canal
stenosis. Repeat study (possibly with sedation) could better
characterize the foramina if clinically indicated.

## 2020-06-27 MED ORDER — PANTOPRAZOLE SODIUM 40 MG PO TBEC
40.0000 mg | DELAYED_RELEASE_TABLET | Freq: Every day | ORAL | Status: DC
  Administered 2020-06-27 – 2020-06-29 (×3): 40 mg via ORAL
  Filled 2020-06-27 (×3): qty 1

## 2020-06-27 MED ORDER — ACETAMINOPHEN 650 MG RE SUPP: 650.0000 mg | Freq: Four times a day (QID) | RECTAL | Status: DC | PRN

## 2020-06-27 MED ORDER — CYANOCOBALAMIN 1000 MCG/ML IJ SOLN
1000.0000 ug | Freq: Once | INTRAMUSCULAR | Status: AC
  Administered 2020-06-27: 1000 ug via INTRAMUSCULAR
  Filled 2020-06-27: qty 1

## 2020-06-27 MED ORDER — ACETAMINOPHEN 325 MG PO TABS: 650.0000 mg | ORAL_TABLET | Freq: Four times a day (QID) | ORAL | Status: DC | PRN

## 2020-06-27 MED ORDER — POLYETHYLENE GLYCOL 3350 17 G PO PACK: 17.0000 g | PACK | Freq: Every day | ORAL | Status: DC | PRN

## 2020-06-27 MED ORDER — SODIUM CHLORIDE 0.9 % IV SOLN
1000.0000 mg | INTRAVENOUS | Status: DC
  Administered 2020-06-27 – 2020-06-28 (×2): 1000 mg via INTRAVENOUS
  Filled 2020-06-27 (×3): qty 8

## 2020-06-27 MED ORDER — CYANOCOBALAMIN 500 MCG PO TABS: 250.0000 ug | ORAL_TABLET | Freq: Every day | ORAL | Status: DC

## 2020-06-27 MED ORDER — ENOXAPARIN SODIUM 40 MG/0.4ML ~~LOC~~ SOLN
40.0000 mg | SUBCUTANEOUS | Status: DC
  Administered 2020-06-27 – 2020-06-28 (×2): 40 mg via SUBCUTANEOUS
  Filled 2020-06-27 (×2): qty 0.4

## 2020-06-27 MED ORDER — VITAMIN B-12 1000 MCG PO TABS
1000.0000 ug | ORAL_TABLET | Freq: Every day | ORAL | Status: DC
  Administered 2020-06-28 – 2020-06-29 (×2): 1000 ug via ORAL
  Filled 2020-06-27 (×2): qty 1

## 2020-06-27 MED ORDER — GADOBUTROL 1 MMOL/ML IV SOLN
10.0000 mL | Freq: Once | INTRAVENOUS | Status: AC | PRN
  Administered 2020-06-27: 10 mL via INTRAVENOUS

## 2020-06-27 NOTE — ED Notes (Signed)
Attempted to give reportx1 

## 2020-06-27 NOTE — ED Provider Notes (Signed)
[facility] EMERGENCY DEPARTMENT Provider Note   CSN: [id number] Arrival date & time: 06/26/20  2107     History No chief complaint on file.   Hunter Bell is a 30 y.o. male who presents for evaluation of left upper extremity numbness, bilateral lower extremity numbness, weakness of his left upper and left lower extremity.  He reports that initially, about a week ago, he felt a burning sensation in his left upper extremity.  He states that over the course of the next 2 days, it progressed into a numbing/tingling sensation.  He states that it feels like pins-and-needles on his arm but he feels like the sensation is decreased on the left upper extremity.  He was seen at [facility] emergency department on 06/25/2020 for evaluation of symptoms.  At that time, he was offered transfer to [facility] for MRI of his brain and C-spine but he declined.  He states that since then, his symptoms have progressively worsened.  He states that it now involves his entire left upper extremity, left lower extremity and parts of his right lower extremity.  He feels like the left lower extremity numbness is extending into his groin.  He states he feels like his left upper extremity is weak and he cannot grasp things as he normally does.  He has still been able to ambulate.  He has not had any urinary or bowel incontinence but does report that he feels some numbness in his groin.  He has not had any fevers.  He does report he had a spider bite onto his left upper extremity but he does not know when that occurred.  Denies any other injections. Denies fevers, weight loss, bowel/bladder incontinence, history of back surgery, history of IVDA, history of cancer, HIV. He denies any CP, SOB, abdominal pain, vision changes, speech changes.   The history is provided by the patient.       No past medical history on file.  There are no problems to display for this patient.   No past surgical history on file.     No  family history on file.  Social History   Tobacco Use  . Smoking status: Never Smoker  . Smokeless tobacco: Never Used  Vaping Use  . Vaping Use: Never used  Substance Use Topics  . Alcohol use: Yes    Comment: occ. use  . Drug use: Never    Home Medications Prior to Admission medications   Medication Sig Start Date End Date Taking? Authorizing Provider  pantoprazole (PROTONIX) 40 MG tablet Take 1 tablet (40 mg total) by mouth daily. 10/15/19   Triplett, Tammy, PA-C    Allergies    Patient has no known allergies.  Review of Systems   Review of Systems  Constitutional: Negative for fever.  Eyes: Negative for visual disturbance.  Respiratory: Negative for cough and shortness of breath.   Cardiovascular: Negative for chest pain.  Gastrointestinal: Negative for abdominal pain, nausea and vomiting.  Genitourinary: Negative for dysuria and hematuria.  Neurological: Positive for weakness and numbness. Negative for speech difficulty and headaches.  All other systems reviewed and are negative.   Physical Exam Updated Vital Signs BP (!) 136/91 (BP Location: Left Arm)   Pulse 87   Temp 98.3 F (36.8 C) (Oral)   Resp 15   SpO2 96%   Physical Exam Vitals and nursing note reviewed.  Constitutional:      Appearance: Normal appearance. He is well-developed.  HENT:  Head: Normocephalic and atraumatic.  Eyes:     General: Lids are normal.     Conjunctiva/sclera: Conjunctivae normal.     Pupils: Pupils are equal, round, and reactive to light.  Cardiovascular:     Rate and Rhythm: Normal rate and regular rhythm.     Pulses: Normal pulses.          Radial pulses are 2+ on the right side and 2+ on the left side.       Dorsalis pedis pulses are 2+ on the right side and 2+ on the left side.     Heart sounds: Normal heart sounds. No murmur heard. No friction rub. No gallop.   Pulmonary:     Effort: Pulmonary effort is normal.     Breath sounds: Normal breath sounds.      Comments: Lungs clear to auscultation bilaterally.  Symmetric chest rise.  No wheezing, rales, rhonchi. Abdominal:     Palpations: Abdomen is soft. Abdomen is not rigid.     Tenderness: There is no abdominal tenderness. There is no guarding.     Comments: Abdomen is soft, non-distended, non-tender. No rigidity, No guarding. No peritoneal signs.  Musculoskeletal:        General: Normal range of motion.     Cervical back: Full passive range of motion without pain.  Skin:    General: Skin is warm and dry.     Capillary Refill: Capillary refill takes less than 2 seconds.  Neurological:     Mental Status: He is alert and oriented to person, place, and time.     Deep Tendon Reflexes:     Reflex Scores:      Patellar reflexes are 2+ on the right side and 1+ on the left side.    Comments: Cranial nerves III-XII intact Follows commands, Moves all extremities  5/5 strength of RUE and RLE. 4/5 strength of LUE and LLE. Diminished grip strength of LUE. Subjective decreased sensation noted to the LUE, LLE and to the right foot and ankle.  He cannot distinguish between sharp and dull on the left upper extremity, left lower extremity and the right foot and ankle distribution of the right lower extremity. Dysmetria present on LUE and LLE, dysdiadochokinesia noted on left upper extremity. No pronator drift. No gait abnormalities  No slurred speech. No facial droop.   Psychiatric:        Speech: Speech normal.     ED Results / Procedures / Treatments   Labs (all labs ordered are listed, but only abnormal results are displayed) Labs Reviewed  CBC WITH DIFFERENTIAL/PLATELET - Abnormal; Notable for the following components:      Result Value   WBC 10.8 (*)    All other components within normal limits  VITAMIN B12 - Abnormal; Notable for the following components:   Vitamin B-12 158 (*)    All other components within normal limits  BASIC METABOLIC PANEL  TSH  RPR  RAPID HIV SCREEN (HIV 1/2 AB+AG)   COPPER, SERUM  ZINC  ENTEROVIRUS PCR  ARBOVIRUS PANEL, [place] [facility]  NEUROMYELITIS OPTICA AUTOAB, IGG  ANTI-MYELIN ASSOC GLYCOP IGG    EKG None  Radiology No results found.  Procedures Procedures   Medications Ordered in ED Medications  cyanocobalamin ((VITAMIN B-12)) injection 1,000 mcg (has no administration in time range)  vitamin B-12 (CYANOCOBALAMIN) tablet 250 mcg (has no administration in time range)    ED Course  I have reviewed the triage vital signs and the nursing notes.  Pertinent labs & imaging results that were available during my care of the patient were reviewed by me and considered in my medical decision making (see chart for details).    MDM Rules/Calculators/A&P                          30 year old male who presents for evaluation of numbness/weakness to his left upper extremity and numbness noted to his bilateral lower extremities.  Initially has been ongoing for about a week but worsened.  Was seen at [facility] on 06/25/2020 and was offered for transfer for MRI but he declined.  Comes in today because symptoms are worse.  On initial arrival, he is afebrile, nontoxic-appearing.  Vital signs are stable.  On exam, he does have some mild decreased range of his left upper extremity, decreased grip strength.  He has some dysdiadochokinesia, dysmetria on exam of his left upper extremity.  He reports decreased sensation noted to left upper extremity as well as left lower extremity.  He also has some areas of his right lower extremity where he has decreased sensation.  Will consult neurology for best course of imaging.  10:47 AM: Discussed with Dr. Derry Lory (Neuro) who will come evaluate patient in the ED.   Discussed with neuro after evaluation of patient. They would like MRI brain and C spine with and without contrast.   BMP is normal. CBC shows leukocytosis of 10.8, VB12 is low.   Patient signed out to Dr. Reuben Likes pending MRI results.   Portions of this  note were generated with Scientist, clinical (histocompatibility and immunogenetics). Dictation errors may occur despite best attempts at proofreading.  Final Clinical Impression(s) / ED Diagnoses Final diagnoses:  Paresthesia    Rx / DC Orders ED Discharge Orders    None       Rosana Hoes 06/27/20 1524    Gwyneth Sprout, MD 06/27/20 2123

## 2020-06-27 NOTE — ED Provider Notes (Signed)
Patient is a 30 year old male who presents with left upper extremity numbness bilateral lower extremity numbness weakness of his left upper and lower extremity.  This initially started a week ago when he was seen in any pen for his symptoms.  They had recommended him to be transferred for MRI brain and C-spine, however patient decided not to at this time.  Since, his symptoms have progressed.  Patient has been seen by neurology here.  They recommended MRI brain, MRI C-spine, and lab work.  After MRI is complete we will follow-up with neurology to plan disposition.  Physical Exam  BP (!) 123/91 (BP Location: Left Arm)   Pulse 66   Temp 97.7 F (36.5 C) (Oral)   Resp 16   Wt 108.4 kg   SpO2 96%   BMI 31.53 kg/m   Physical Exam Vitals and nursing note reviewed.  Constitutional:      Appearance: He is well-developed.  HENT:     Head: Normocephalic and atraumatic.     Nose: Nose normal.  Eyes:     Extraocular Movements: Extraocular movements intact.     Conjunctiva/sclera: Conjunctivae normal.  Cardiovascular:     Rate and Rhythm: Normal rate and regular rhythm.  Pulmonary:     Effort: Pulmonary effort is normal. No respiratory distress.  Abdominal:     Palpations: Abdomen is soft.     Tenderness: There is no abdominal tenderness.  Musculoskeletal:     Cervical back: Neck supple.  Skin:    General: Skin is warm and dry.  Neurological:     Mental Status: He is alert and oriented to person, place, and time.     Sensory: Sensory deficit present.     Motor: Weakness present.     ED Course/Procedures     Procedures MR BRAIN W WO CONTRAST  Final Result    MR CERVICAL SPINE W WO CONTRAST  Final Result     Labs Reviewed  CBC WITH DIFFERENTIAL/PLATELET - Abnormal; Notable for the following components:      Result Value   WBC 10.8 (*)    All other components within normal limits  VITAMIN B12 - Abnormal; Notable for the following components:   Vitamin B-12 158 (*)    All  other components within normal limits  RESP PANEL BY RT-PCR (FLU A&B, COVID) ARPGX2  BASIC METABOLIC PANEL  TSH  RAPID HIV SCREEN (HIV 1/2 AB+AG)  RPR  COPPER, SERUM  ZINC  ENTEROVIRUS PCR  NEUROMYELITIS OPTICA AUTOAB, IGG  ANTI-MYELIN ASSOC GLYCOP IGG  CBC WITH DIFFERENTIAL/PLATELET  COMPREHENSIVE METABOLIC PANEL  ARBOVIRUS PANEL, [place] [facility]    MDM   MRI with evidence for demyelinating lesion in the cervical spine.  Neurology aware of these findings.  Patient to be admitted to medicine for further work-up and evaluation of his presenting symptoms.       Faatima Tench, [place], MD 06/27/20 0454    Gerhard Munch, MD 06/27/20 [phone]

## 2020-06-27 NOTE — H&P (Signed)
Date: 06/27/2020               Patient Name:  Hunter Bell MRN: [id number]  DOB: May 17, 1990 Age / Sex: 30 y.o., male   PCP: Patient, No Pcp Per (Inactive)         Medical Service: Internal Medicine Teaching Service         Attending Physician: Dr. Carlynn Purl    First Contact: Dr. Imogene Burn Pager: [phone]  Second Contact: Dr. Huel Cote Pager: [phone]       After Hours (After 5p/  First Contact Pager: [phone]  weekends / holidays): Second Contact Pager: [phone]   Chief Complaint: Paresthesia  History of Present Illness:   Hunter Bell is a 30 y.o. male with no significant medical history presenting for numbness in his left arm which started last Thursday, 9 days ago.  It began with a hypersensitivity with feeling of burning with any light touch, which then progressed to pins-and-needles then numbness.  This then spread over his chest, remainder of his trunk, and then his bilateral legs.  He also notes that he does not feel its when he urinates.  Has not had bowel or bladder incontinence because he has been careful.  He is also reporting some weakness in the left arm and bilateral legs.  Walking has been manageable when he is looking.  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  Started this job a few weeks ago, does not feel like there is any relevant exposure at work.  Has felt well prior to the onset, denies any respiratory or gastrointestinal illness, no recent vaccinations.  Has not had his Covid vaccines, or recent flu vaccines.  He does note an insect bite on his left upper arm with some itching which he first noticed around the time of onset.  He was initially seen at [facility] weeks ago and had negative CT head, then sent to [facility] for MRI which he declined due to wait time.  Presented again today due to worsening symptoms.  ED Course: Evaluated by neurology who recommend admission for further work-up and treatment with 5 days of Solu-Medrol  for possible demyelinating disease.  Current Outpatient Medications  Medication Instructions  . pantoprazole (PROTONIX) 40 mg, Oral, Daily    Allergies as of 06/26/2020  . (No Known Allergies)    History reviewed. No pertinent past medical history.  History reviewed. No pertinent surgical history.  Family History  Problem Relation Age of Onset  . Multiple sclerosis Neg Hx     Social History   Tobacco Use  . Smoking status: Never Smoker  . Smokeless tobacco: Never Used  Vaping Use  . Vaping Use: Never used  Substance Use Topics  . Alcohol use: Yes    Comment: occ. use  . 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  These are made of plastic and usually some fumes, but he feels that her workplace exposure is unlikely related to his current symptoms.  Review of Systems: Review of Systems  Constitutional: Negative for chills and fever.  HENT: Negative for congestion and sore throat.   Eyes: Negative for blurred vision and double vision.  Respiratory: Negative for cough and shortness of breath.   Cardiovascular: Negative for chest pain and leg swelling.  Gastrointestinal: Negative for abdominal pain, diarrhea, nausea and vomiting.  Musculoskeletal: Negative for back pain and myalgias.  Skin: Positive for itching and rash.  Neurological: Positive for tingling, sensory change and focal weakness. Negative for dizziness, speech change, seizures, loss of consciousness and headaches.  All other systems reviewed and are negative.    Physical Exam: Blood pressure 122/66, pulse 88, temperature 97.9 F (36.6 C), temperature source Oral, resp. rate 16, SpO2 95 %. Physical Exam Vitals and nursing note reviewed.  Constitutional:      General: He is not in acute distress.    Appearance: Normal appearance. He is not ill-appearing or toxic-appearing.     Comments: Sleeping comfortably at beginning of interview  HENT:     Head:  Normocephalic and atraumatic.     Right Ear: External ear normal.     Left Ear: External ear normal.     Nose: No congestion or rhinorrhea.     Mouth/Throat:     Mouth: Mucous membranes are moist.     Pharynx: No oropharyngeal exudate or posterior oropharyngeal erythema.  Eyes:     Extraocular Movements: Extraocular movements intact.     Pupils: Pupils are equal, round, and reactive to light.  Cardiovascular:     Rate and Rhythm: Normal rate and regular rhythm.     Heart sounds: No murmur heard. No friction rub. No gallop.   Pulmonary:     Effort: Pulmonary effort is normal. No respiratory distress.     Breath sounds: No wheezing, rhonchi or rales.  Abdominal:     General: Abdomen is flat.     Palpations: Abdomen is soft.  Musculoskeletal:        General: No swelling. Normal range of motion.     Cervical back: Normal range of motion and neck supple. No rigidity.  Skin:    General: Skin is warm and dry.     Comments: Small punctate lesion on right upper arm consistent with bug bite with excoriations  Neurological:     Mental Status: He is alert and oriented to person, place, and time.     Cranial Nerves: No dysarthria or facial asymmetry.     Deep Tendon Reflexes:     Reflex Scores:      Bicep reflexes are 2+ on the right side and 2+ on the left side.      Patellar reflexes are 2+ on the right side and 2+ on the left side.      Achilles reflexes are 0 on the right side and 0 on the left side.    Comments: Cranial nerves II through XII intact Sensation intact down to the shoulders, left arm is numb compared to the right, and sensation is impaired throughout the trunk and lower extremities Right upper extremity strength 5 out of 5 Left upper extremity strength 4 out of 5 Bilateral lower extremity strength 4 out of 5  Hoffmann sign negative bilaterally      EKG: not obtained  CXR: not obtained  MR BRAIN W WO CONTRAST  Result Date: 06/27/2020 CLINICAL DATA:  Multiple  sclerosis suspected. EXAM: MRI HEAD WITHOUT AND WITH CONTRAST TECHNIQUE: Multiplanar, multiecho pulse sequences of the brain and surrounding structures were obtained without and with intravenous contrast. CONTRAST:  70mL GADAVIST GADOBUTROL 1 MMOL/ML IV SOLN COMPARISON:  None. FINDINGS: Brain: No acute infarction, hemorrhage, hydrocephalus, extra-axial collection or mass lesion. No visible white matter lesions. No abnormal enhancement or restricted diffusion. Vascular: Major arterial flow voids are maintained at the skull base. Skull and upper cervical spine: Normal marrow signal. Please see concurrent MRI of the cervical  spine for evaluation of a cord lesion. Sinuses/Orbits: Mild inferior maxillary sinuses and ethmoid air cell mucosal thickening. Other: No mastoid effusions. IMPRESSION: 1. No acute intracranial abnormality. No visible white matter lesions or abnormal enhancement. 2. Please see concurrent MRI of the cervical spine for evaluation of a cord lesion. Electronically Signed   By: Feliberto Harts MD   On: 06/27/2020 16:12   MR CERVICAL SPINE W WO CONTRAST  Result Date: 06/27/2020 CLINICAL DATA:  Suspicion for multiple sclerosis. Check for lesions. EXAM: MRI CERVICAL SPINE WITHOUT AND WITH CONTRAST TECHNIQUE: Multiplanar and multiecho pulse sequences of the cervical spine, to include the craniocervical junction and cervicothoracic junction, were obtained without and with intravenous contrast. CONTRAST:  18mL GADAVIST GADOBUTROL 1 MMOL/ML IV SOLN COMPARISON:  None. FINDINGS: Motion limited evaluation. Alignment: Straightening of the normal cervical lordosis. No substantial sagittal subluxation. Vertebrae: Vertebral body heights are maintained. No specific evidence of acute fracture, discitis/osteomyelitis, or suspicious bone lesion. Cord: At C4-C5 there is a short segment area of T2/stir hyperintensity within the midline dorsal cord which demonstrates irregular enhancement. This area is mildly expanded.  Posterior Fossa, vertebral arteries, paraspinal tissues: Posterior fossa is further characterized on concurrent MRI head. Vertebral artery flow voids are maintained. Disc levels: C2-C3: Mild uncovertebral hypertrophy without evidence of significant canal or foraminal stenosis. C3-C4: Motion limited evaluation no significant canal stenosis. Bilateral uncovertebral hypertrophy with possible mild bilateral foraminal stenosis. C4-C5: Motion limited evaluation. Bilateral uncovertebral hypertrophy without significant canal stenosis. Possible mild left foraminal stenosis. C5-C6: No significant disc protrusion, foraminal stenosis, or canal stenosis. C6-C7: No significant disc protrusion, foraminal stenosis, or canal stenosis. C7-T1: No significant disc protrusion, foraminal stenosis, or canal stenosis. IMPRESSION: 1. Mildly expansile short-segment T2/STIR hyperintensity within the dorsal cord at C4-C5 with irregular enhancement. Findings are most consistent with active demyelination given the characteristic appearance and the provided clinical history. Infection, transverse myelitis, or tumor are less likely differential considerations. 2. Motion limited evaluation with possibly mild foraminal stenosis bilaterally at C3-C4 and on the left at C4-C5. No significant canal stenosis. Repeat study (possibly with sedation) could better characterize the foramina if clinically indicated. Electronically Signed   By: Feliberto Harts MD   On: 06/27/2020 15:50    Assessment & Plan by Problem: Active Problems:   Acute muscle weakness   Hunter Bell is a 30 y.o. male with no contributing medical history presenting for paresthesia and focal weakness.  MRI of C-spine with area of T2 hyperintensity, also B12 deficient.  Starting high-dose steroids and B12 repletion per neurology.  Paresthesia focal weakness with cervical spine demyelination Paresthesia affecting the left upper extremity, entire trunk, and bilateral lower  extremities as well as weakness of the left upper extremity and lower extremities. MRI of the spine with hyperintensity within the cord at C4-C5, consistent with active demyelination.  No lesions on the brain. Differential includes demyelinating disease, acute viral myelitis, syrinx, nutritional. -Neurology following -Start high-dose steroids, Solu-Medrol 1000 mg IV daily, for possible multiple sclerosis -Follow-up MRI brain and C-spine -Follow-up enterovirus, West Nile, RPR, HIV, copper, zinc, NML, anti-MOG Ab  B12 deficiency B12 level of 158.  He does not follow any particular diet.  Does note more junk food recently. -Per neuro, vitamin B12 1000 mg IM once, then 1000 mg p.o. daily indefinitely   Dispo: Admit patient to Inpatient with expected length of stay greater than 2 midnights.  Signed: Remo Lipps, MD 06/27/2020, 6:22 PM  Pager: [phone]  After 5pm on weekdays and 1pm  on weekends: On Call pager: [phone]

## 2020-06-27 NOTE — Consult Note (Addendum)
Neurology Consultation  Reason for Consult: parasthesias Referring Physician: Salley Scarlet, MD ED  CC: progressive parasthesias  History is obtained from: patient, chart  HPI: Hunter Bell is a 30 y.o. male with no significant PMHx who presents to [facility] ED today due to paraesthesias which continue to worsen and affect additional parts of his body daily. Patient was seen at [facility] on 06/24/20-06/25/20 with similar complaints. States he had a CTH, but when he had to wait 2 more hours for a MRI, he declined and was discharged. He states that since then his symptoms have progressed so he returned today.   Denies HA, pain, visual disturbances, nor urinary or bowel incontinence.   At [facility], his labs were basically normal except for an ALT of 66. Sed rate 2, Glucose 104, and CTH was a normal study.   Progression of symptoms as follow:  1. Last Thursdays, 10 days ago, his symptoms started by severe burning in the LUE. States he could not stand to be touched in LUE.  2. Next day, he noticed his LUE was numb and tingling comparable to "sleeping wrong on his arm". Right deltoid/shoulder area was affected with progressing to 1/2 of his left hand affecting the thumb side. Also, pointer and and middle finger. 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  3. On the day after, his symptoms progressed to involvement of his abdomen and LLE, but not "terrible". 4. On next day, symptoms progressed to involve his trunk, perineum, and RUE, and in 4th and 5th fingers and that half of hand. His RUE/RLE have not been "near as bad" as the left.In re: to perineum, he states he still had sensation for urinating and having a BM, but he could not feel his buttocks when he sat down on toilet. He also states that he could not tell when he was finished with a BM. He did not feel like he was urinating. He progressed to the point where he was dropping things with his left hand and not knowing where his  thumb was. He had difficulty with grasping on that side.  5. He was then seen in the ED at [facility] from 06/24/20 to 06/25/20 when doctor note was placed. His CTH was a normal study and patient did not want to wait another 2 hours for the MRI brain. MD there informed patient that his presentation was suspicious for MS.  7. Today, he decided to come to [facility] ED because his symptoms worsening daily. Has progressed to not being able to feel his left toes which have made ambulating difficult and he has tripped at home. His paraesthesias are now involving the right foot.   Patient has had no pain other than burning on the first day of symptoms.   ROS: A 14 point ROS was performed and is negative except as noted in the HPI.    No past medical history on file.  No family history on file.  Social History:   reports that he has never smoked. He has never used smokeless tobacco. He reports current alcohol use. He reports that he does not use drugs. ETOH use is social and rare.   Medications No current facility-administered medications for this encounter.  Current Outpatient Medications:  .  pantoprazole (PROTONIX) 40 MG tablet, Take 1 tablet (40 mg total) by mouth daily., Disp: 14 tablet, Rfl: 0  Exam: Current vital signs: BP (!) 136/91 (BP Location: Left Arm)   Pulse 87  Temp 98.3 F (36.8 C) (Oral)   Resp 15   SpO2 96%  Vital signs in last 24 hours: Temp:  [98.3 F (36.8 C)-98.5 F (36.9 C)] 98.3 F (36.8 C) (04/02 0923) Pulse Rate:  [78-88] 87 (04/02 0923) Resp:  [15-18] 15 (04/02 0923) BP: (127-138)/(83-99) 136/91 (04/02 0923) SpO2:  [91 %-98 %] 96 % (04/02 0923)  GENERAL: Awake, alert in NAD HEENT: - Normocephalic and atraumatic. No pain in neck and is able to extend and flex without difficulty. No neck stiffness on exam.  LUNGS - Normal respiratory effort. SaO2 CV - RRR ABDOMEN - Soft, nontender Ext: warm, well perfused Psych: Light affect, but concerned.   NEURO:  Mental Status:  AA&Ox3  Speech/Language: speech is without dysarthria or aphasia. Naming, repetition, fluency, and comprehension intact. Cranial Nerves:  II: PERRL 27mm/brisk. visual fields full.  III, IV, VI: EOMI. Lid elevation symmetric and full.  V: sensation is intact and symmetrical to face with light touch and cold temperature.  VII: Smile is symmetrical. Able to puff cheeks and raise eyebrows.  VIII:hearing intact to voice IX, X: palate elevation is symmetric. Phonation normal.  XI: normal sternocleidomastoid and trapezius muscle strength CHE:NIDPOE is symmetrical without fasciculations.   Motor: 5/5 strength is all muscle groups.  Tone is normal. Bulk is normal.  Sensation- Intact to light touch bilaterally in all four extremities. Extinction absent to light touch to DSS. Vibratory sense in decreased to left hand and left midfoot to toes. Light touch is intact to LUE and left thigh down to ankle is intact. He can not feel light touch to his left toes or ends of left pointer. Cold is decreased from the left mid forearm to end of left hand. On right side, he can feel light touch to entire RUE and RLE. Cold is intact to right arm and leg.  Coordination: FTN intact bilaterally. HKS intact bilaterally. No pronator drift.  DTRs: 2+ throughout with exception of absence in left biceps. Plantars are mute bilaterally.  Gait- deferred  Labs I have reviewed labs in epic and the results pertinent to this consultation are insignificant.   CBC    Component Value Date/Time   WBC 9.6 06/25/2020 0240   RBC 5.44 06/25/2020 0240   HGB 16.7 06/25/2020 0240   HCT 49.5 06/25/2020 0240   PLT 277 06/25/2020 0240   MCV 91.0 06/25/2020 0240   MCH 30.7 06/25/2020 0240   MCHC 33.7 06/25/2020 0240   RDW 13.3 06/25/2020 0240   LYMPHSABS 2.5 06/25/2020 0240   MONOABS 1.1 (H) 06/25/2020 0240   EOSABS 0.2 06/25/2020 0240   BASOSABS 0.1 06/25/2020 0240    CMP     Component Value Date/Time   NA 137 06/25/2020 0240    K 3.8 06/25/2020 0240   CL 105 06/25/2020 0240   CO2 23 06/25/2020 0240   GLUCOSE 104 (H) 06/25/2020 0240   BUN 14 06/25/2020 0240   CREATININE 1.04 06/25/2020 0240   CALCIUM 9.2 06/25/2020 0240   PROT 7.9 06/25/2020 0240   ALBUMIN 4.2 06/25/2020 0240   AST 34 06/25/2020 0240   ALT 66 (H) 06/25/2020 0240   ALKPHOS 75 06/25/2020 0240   BILITOT 0.4 06/25/2020 0240   GFRNONAA >60 06/25/2020 0240    Imaging  CT head at [facility] 06/25/20 was negative.   MRI brain and Cspine with and without contast  1. Mildly expansile short-segment T2/STIR hyperintensity within the dorsal cord at C4-C5 with irregular enhancement. Findings are most consistent with active  demyelination given the characteristic appearance and the provided clinical history. Infection, transverse myelitis, or tumor are less likely differential considerations. 2. Motion limited evaluation with possibly mild foraminal stenosis bilaterally at C3-C4 and on the left at C4-C5. No significant canal stenosis. Repeat study (possibly with sedation) could better characterize the foramina if clinically indicated.  Assessment: 30 yo male with no PMHx who presents today with c/o 10 days of paraesthesias with progression of symptoms along with involvement of difficult portions of body. CTH at [facility] was negative and MRI here is ordered. His presentation was suspicious for MS. However, MRI brain and C spine showed finding consistent with active demyelination and enhancing spinal lesion at C3-4. Because myelitis can be associated with certain illnesses, such as HIV, West Nile, and Syphilis, multiple labs are ordered.   Impression: 1. Demyelination of CSpine. 2. Vitamin B12 deficiency.  Recommendations: Below have all been ordered.  1. Medicine admit 2. High dose steroids-Solumedrol 1000mg  IV qd until discharged. Then, start Vitamin B12 1000mg  po qd indefinitely.  3. PT/OT 4. Enterovirus PCR, West Nile, RPR, HIV Ab, Copper, Zinc, NMO and AntiMOG  Ab.  5. Vitamin B 12 1000mg  IM once, then 1000mg  po qd indefinitely.   Pt seen by , NP/Neuro and later by MD. Note/plan to be edited by MD as needed.  Pager:  NEUROHOSPITALIST ADDENDUM Performed a face to face diagnostic evaluation.   I have reviewed the contents of history and physical exam as documented by PA/ARNP/Resident and agree with above documentation.  I have discussed and formulated the above plan as documented. Edits to the note have been made as needed.  Impression/Key exam findings/Plan: 43M ,healthy otherwise p/w acute progressive paresthesias initially, then weakness. Had L hand numbness 10 days ago -> left arm + chest -> Left leg -> preineum and R leg. Unable to tell when he is done with urination or bowel movement now and now has left hand incoordination and weakness. No lhermittes sign. No recent hikes or outdoor activities. No family hx of MS. No recreational substance use. Exam with L hand grip weakness, decreased vibration and proprioception along with decreased fine touch in LUE, LLE more than RUE and RLE. Reflexes symmetric except for L biceps is absent.  Symptoms do localize to cervical cord and above. Ddx: demyelinating disease like MS/NMO/MOGAb syndrome vs acute viral myelitis vs less likely syrinx vs cord compression vs nutritional vs AVM vs syphilis vs HIV(verbal consent obtained). Low concern for parasitic or tuberculosis.  Will start with MRI Brain and C spine with and without contrast.  Enterovirus PCR, West Nile, RPR, HIV Ab, Copper, Zinc, NMO and AntiMOG Ab. Ordered.   , MD [facility]   If 7pm to 7am, please call on call as listed on AMION.

## 2020-06-28 DIAGNOSIS — R2990 Unspecified symptoms and signs involving the nervous system: Secondary | ICD-10-CM

## 2020-06-28 DIAGNOSIS — E538 Deficiency of other specified B group vitamins: Secondary | ICD-10-CM | POA: Diagnosis present

## 2020-06-28 DIAGNOSIS — G379 Demyelinating disease of central nervous system, unspecified: Secondary | ICD-10-CM

## 2020-06-28 LAB — CBC WITH DIFFERENTIAL/PLATELET
Abs Immature Granulocytes: 0.06 10*3/uL (ref 0.00–0.07)
Basophils Absolute: 0 10*3/uL (ref 0.0–0.1)
Basophils Relative: 0 %
Eosinophils Absolute: 0 10*3/uL (ref 0.0–0.5)
Eosinophils Relative: 0 %
HCT: 50.5 % (ref 39.0–52.0)
Hemoglobin: 17.4 g/dL — ABNORMAL HIGH (ref 13.0–17.0)
Immature Granulocytes: 1 %
Lymphocytes Relative: 11 %
Lymphs Abs: 0.9 10*3/uL (ref 0.7–4.0)
MCH: 30.5 pg (ref 26.0–34.0)
MCHC: 34.5 g/dL (ref 30.0–36.0)
MCV: 88.6 fL (ref 80.0–100.0)
Monocytes Absolute: 0.1 10*3/uL (ref 0.1–1.0)
Monocytes Relative: 1 %
Neutro Abs: 7.3 10*3/uL (ref 1.7–7.7)
Neutrophils Relative %: 87 %
Platelets: 304 10*3/uL (ref 150–400)
RBC: 5.7 MIL/uL (ref 4.22–5.81)
RDW: 12.9 % (ref 11.5–15.5)
WBC: 8.3 10*3/uL (ref 4.0–10.5)
nRBC: 0 % (ref 0.0–0.2)

## 2020-06-28 LAB — COMPREHENSIVE METABOLIC PANEL
ALT: 65 U/L — ABNORMAL HIGH (ref 0–44)
AST: 37 U/L (ref 15–41)
Albumin: 4.2 g/dL (ref 3.5–5.0)
Alkaline Phosphatase: 81 U/L (ref 38–126)
Anion gap: 10 (ref 5–15)
BUN: 12 mg/dL (ref 6–20)
CO2: 24 mmol/L (ref 22–32)
Calcium: 9.5 mg/dL (ref 8.9–10.3)
Chloride: 101 mmol/L (ref 98–111)
Creatinine, Ser: 1.22 mg/dL (ref 0.61–1.24)
GFR, Estimated: >60 mL/min (ref >60)
Glucose, Bld: 132 mg/dL — ABNORMAL HIGH (ref 70–99)
Potassium: 4.2 mmol/L (ref 3.5–5.1)
Sodium: 135 mmol/L (ref 135–145)
Total Bilirubin: 0.9 mg/dL (ref 0.3–1.2)
Total Protein: 7.9 g/dL (ref 6.5–8.1)

## 2020-06-28 LAB — RPR: RPR Ser Ql: NONREACTIVE

## 2020-06-28 MED ORDER — ADULT MULTIVITAMIN W/MINERALS CH
1.0000 | ORAL_TABLET | Freq: Every day | ORAL | Status: DC
  Administered 2020-06-28 – 2020-06-29 (×2): 1 via ORAL
  Filled 2020-06-28 (×2): qty 1

## 2020-06-28 NOTE — Evaluation (Signed)
Occupational Therapy Evaluation Patient Details Name: Hunter Bell MRN: [id number] DOB: 14-Oct-1990 Today's Date: 06/28/2020    History of Present Illness Pt is a 30 y.o. male admitted 06/26/20 with c/o LUE numbness, which started 9 days prior, and spread over chest/trunk and BLEs, as well as LUE/BLE weakness. MRI with no acute intracranial abnormality. 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 Pt started on high-dose steroids and B12 repletion. No PMH on file.   Clinical Impression   This 30 yo male admitted with above presents to acute OT decreased use of LUE (hand more involved then rest of arm) thus affecting the way he has to do all activites that require Bil UE. He does need A for some things (Ie: putting his hair up in pony tail holder) but otherwise he has found was to adapt to the decreased use of LUE. I gave him activities to work on and encouraged him to use, use, use his LUE. OP OT is recommended post discharge. We will sign off.    Follow Up Recommendations  Outpatient OT    Equipment Recommendations  None recommended by OT       Precautions / Restrictions Precautions Precautions: None Restrictions Weight Bearing Restrictions: No      Mobility Bed Mobility Overal bed mobility: Independent                  Transfers Overall transfer level: Independent                    Balance Overall balance assessment: Independent                                         ADL either performed or assessed with clinical judgement   ADL Overall ADL's : Modified independent                                             Vision Patient Visual Report: No change from baseline              Pertinent Vitals/Pain Pain Assessment: No/denies pain     Hand Dominance Right   Extremity/Trunk Assessment Upper Extremity  Assessment Upper Extremity Assessment: LUE deficits/detail LUE Deficits / Details: L shoulder and elbow and forearm 5/5 AROM, wrist 3/5, hand 3/5 but with decreased coordination for composite finger flexion/extension, cannot adduct/abduct fingers in coordinated manner, can oppose thumb to fingers with concerted effort, cannot really pick items up-but can slide them off surfaces to pick them up, decreased control overall in hand, reports pins/needles feeling, drops items out of his hand when not looking at them, says his arm when he raises it feels like it has a 20# brick on it. Intack for hot/cold but states that it feels more hot and cold than RUE           Communication Communication Communication: No difficulties   Cognition Arousal/Alertness: Awake/alert Behavior During Therapy: WFL for tasks assessed/performed Overall Cognitive Status: Within Functional Limits for tasks assessed  Exercises Other Exercises Other Exercises: Educated pt on theraputty hand exercises (red) and FM handout activities        Home Living Family/patient expects to be discharged to:: Private residence Living Arrangements: Spouse/significant other;Children Available Help at Discharge: Family;Available PRN/intermittently Type of Home: House Home Access: Stairs to enter [employer] of Steps: 6 Entrance Stairs-Rails: Right;Left Home Layout: Two level;Able to live on main level with bedroom/bathroom     Bathroom Shower/Tub: Chief Strategy Officer: Standard     Home Equipment: Grab bars - tub/shower   Additional Comments: Lives with wife and two kids (9 mo, 3 yo)      Prior Functioning/Environment Level of Independence: Independent        Comments: Works, drives; enjoys watching sports and playing video games        OT Problem List: Decreased strength;Decreased range of motion;Decreased coordination         OT  Goals(Current goals can be found in the care plan section) Acute Rehab OT Goals Patient Stated Goal: for LUE to get back to normal  OT Frequency:                AM-PAC OT "6 Clicks" Daily Activity     Outcome Measure Help from another person eating meals?: None Help from another person taking care of personal grooming?: None Help from another person toileting, which includes using toliet, bedpan, or urinal?: None Help from another person bathing (including washing, rinsing, drying)?: None Help from another person to put on and taking off regular upper body clothing?: None Help from another person to put on and taking off regular lower body clothing?: None 6 Click Score: 24   End of Session    Activity Tolerance: Patient tolerated treatment well Patient left:  (getting ready to get in shower with wife A)  OT Visit Diagnosis: Hemiplegia and hemiparesis;Muscle weakness (generalized) (M62.81) Hemiplegia - Right/Left: Left Hemiplegia - dominant/non-dominant: Non-Dominant Hemiplegia - caused by: Unspecified                Time: 0093-8182 OT Time Calculation (min): 37 min Charges:  OT General Charges $OT Visit: 1 Visit OT Evaluation $OT Eval Moderate Complexity: 1 Mod OT Treatments $Therapeutic Exercise: 8-22 mins  Ignacia Palma, OTR/L Acute [employer] Pager [phone] Office [phone]     Evette Georges 06/28/2020, 4:51 PM

## 2020-06-28 NOTE — Progress Notes (Signed)
   Subjective:  Patient reports that he does feel about 30% better today since starting steroids yesterday.  No new symptoms.  Looking forward to discharge home.  Objective:  Vital signs in last 24 hours: Vitals:   06/27/20 2003 06/27/20 2020 06/27/20 2348 06/28/20 0408  BP:  (!) 123/91 117/84 120/86  Pulse:  66 (!) 104 (!) 106  Resp:  16 17 16   Temp: 98.9 F (37.2 C) 97.7 F (36.5 C) 98.7 F (37.1 C) 98.2 F (36.8 C)  TempSrc:  Oral Oral Oral  SpO2:  96% 93% 93%  Weight:  108.4 kg      Physical Exam Constitutional: no acute distress Head: atraumatic ENT: external ears normal Eyes: EOMI Pulmonary: effort normal Abdominal: flat Skin: warm and dry Neurological: alert, cranial nerves grossly intact.  Decrease sensation over left arm, trunk, bilateral lower extremities.  Left upper extremity strength 4 out of 5 but seems mildly improved compared to yesterday.  Psychiatric: normal mood and affect  Assessment/Plan: Hunter Bell is a 30 y.o. male with no contributing medical history presenting for paresthesia and focal weakness.  MRI of C-spine with area of T2 hyperintensity, also B12 deficient.  Starting high-dose steroids and B12 repletion per neurology.  Active Problems:   Acute muscle weakness  Paresthesia and focal weakness with cervical spine demyelination B12 deficiency MRI of the spine with hyperintensity within the cord at C4-C5, consistent with active demyelination.  No lesions on the brain.  MS is a possibility as well as other demyelinating disease, and B12 deficiency. Also ruling out viral myelitis. B12 level of 158.  He does not follow any particular diet, no surgical history or other history suggesting malabsorption. -Neurology following -continue high-dose steroids, Solu-Medrol 1000 mg IV daily, day 2/5 -Follow-up enterovirus, West Nile, RPR, copper, zinc, NML, anti-MOG Ab -Follow vitamin D -s/p vitamin B12 1000 mg IM x1, continue with 1000 mg p.o. daily  indefinitely  Diet:  regular IVF:  none VTE:  lovenox Prior to Admission Living Arrangement:  home Anticipated Discharge Location:  home Barriers to Discharge:  Clinical improvement Dispo: Anticipated discharge in approximately 1-2 day(s).   26, MD 06/28/2020, 6:28 AM Pager: [phone] After 5pm on weekdays and 1pm on weekends: On Call pager [phone]

## 2020-06-28 NOTE — Progress Notes (Signed)
Neurology Progress Note  S: Rates his improvement as 35% of his normal state of health. States he can sense and use his left thumb more efficiently. States he can feel his toes now. He states his sensation is returning everywhere except that associated with toileting. He states his numbness and tingling is waxing and waning. He has been up he is walking better. States that for awhile, since marriage and having children, his diet has been terrible and he has gained weight. Regarding his low B12 level, he is not a vegetarian or vegan.   O: Current vital signs: BP 120/86 (BP Location: Left Arm)   Pulse (!) 106   Temp 98.2 F (36.8 C) (Oral)   Resp 16   Wt 108.4 kg   SpO2 93%   BMI 31.53 kg/m  Vital signs in last 24 hours: Temp:  [97.7 F (36.5 C)-98.9 F (37.2 C)] 98.2 F (36.8 C) (04/03 0408) Pulse Rate:  [66-106] 106 (04/03 0408) Resp:  [16-17] 16 (04/03 0408) BP: (117-123)/(66-91) 120/86 (04/03 0408) SpO2:  [93 %-96 %] 93 % (04/03 0408) Weight:  [108.4 kg] 108.4 kg (04/02 2020)  GENERAL: Awake, alert in NAD HEENT: Normocephalic and atraumatic LUNGS: Normal respiratory effort.  CV: RRR on tele.  ABDOMEN: Soft, nontender Ext: warm  NEURO:  Mental Status: AA&Ox3  Speech/Language: speech is without aphasia or dysarthria.  Proprioception is decreased on the left great toe.   III, IV, VI: Eyelids elevate symmetrically.  VII: Smile is symmetrical. VIII: hearing intact to voice. IX, X: Phonation is normal.  Motor: 5/5 throughout with exception of 4/5 left biceps, grip, wrist, and fingers.   Tone: is normal and bulk is normal Sensation- Intact to light touch face, BUEs, and BLEs. Extinction absent to light touch to DSS.    Coordination: FTN and HKS is ataxic on the left.  Gait- deferred  Medications  Current Facility-Administered Medications:  .  acetaminophen (TYLENOL) tablet 650 mg, 650 mg, Oral, Q6H PRN **OR** acetaminophen (TYLENOL) suppository 650 mg, 650 mg, Rectal,  Q6H PRN, Verdene Lennert, MD .  enoxaparin (LOVENOX) injection 40 mg, 40 mg, Subcutaneous, Q24H, Verdene Lennert, MD, 40 mg at 06/27/20 1945 .  methylPREDNISolone sodium succinate (SOLU-MEDROL) 1,000 mg in sodium chloride 0.9 % 50 mL IVPB, 1,000 mg, Intravenous, Q24H, Erick Blinks, MD, Stopped at 06/27/20 2048 .  multivitamin with minerals tablet 1 tablet, 1 tablet, Oral, Daily, Erick Blinks, MD, 1 tablet at 06/28/20 [phone] .  pantoprazole (PROTONIX) EC tablet 40 mg, 40 mg, Oral, Daily, Erick Blinks, MD, 40 mg at 06/28/20 0938 .  polyethylene glycol (MIRALAX / GLYCOLAX) packet 17 g, 17 g, Oral, Daily PRN, Verdene Lennert, MD .  vitamin B-12 (CYANOCOBALAMIN) tablet 1,000 mcg, 1,000 mcg, Oral, Daily, Erick Blinks, MD, 1,000 mcg at 06/28/20 1093  Pertinent Labs HIV non reactive    TSH normal  Imaging MD has reviewed images in epic and the results pertinent to this consultation are:  MRI brain and Cspine with and without contast  Brain: 1. No acute intracranial abnormality. No visible white matter lesions or abnormal enhancement. Cspine: 1. Mildly expansile short-segment T2/STIR hyperintensity within the dorsal cord at C4-C5 with irregular enhancement. Findings are most consistent with active demyelination given the characteristic appearance and the provided clinical history. Infection, transverse myelitis, or tumor are less likely differential considerations. 2. Motion limited evaluation with possibly mild foraminal stenosis bilaterally at C3-C4 and on the left at C4-C5. No significant canal stenosis. Repeat study (possibly with sedation) could better  characterize the foramina if clinically indicated.  Assessment: 30 yo male who presented yesterday with 10 days of paraesthesias with progression of symptoms. On initial exam, suspicion for MS was on differential. After the finding of demyelination and lesion at C4-C5, we believe this to be transverse myelitis. Patient is  improving although MD thinks it is less than patient's rating of 35%. He will need PT/OT and opinion on long term therapy. He is day 2 of high dose steroids. Will see what PT/OT thinks about rehab-in patient vs out patient and patient may be able to go home in a couple of days as long as he continues to improve. He will need high dose po steroids when he goes home. He admits to poor nutrition and his B12 level was very low. He has been started on Vit B12 high dose and MVI.  Impression: 1. Demyelination of Cspine/Transverse Myeliltis  Plan: 1. Continue high dose IV steroids while in hospital, then can change to po upon discharge.  2. PT/OT and will involve them in patient's rehab plan.  3. Vit B12 low, continue to supplement and stay on for life.  4. Poor nutrition-MVI po qd for life.  5. Extensive labs (Enterovirus PCR, West Nile, RPR, Copper, Zinc, NMO and AntiMOG Ab)are pending to evaluate for virus and further nutritional deficits as abnormalities can cause CSpine presentations such as his.   Pt seen by Jimmye Norman, MSN, APN-BC/Nurse Practitioner/Neuro and by MD. Note and plan to be edited as needed by MD.  Pager: [phone]   NEUROHOSPITALIST ADDENDUM Performed a face to face diagnostic evaluation.   I have reviewed the contents of history and physical exam as documented by PA/ARNP/Resident and agree with above documentation.  I have discussed and formulated the above plan as documented. Edits to the note have been made as needed.  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 No other CNS lesions concerning for potential MS. Reports improving symptoms (mostly sensory improvement) after just 1 dose of IV MethylPrednisone. Coordination of L hand does look slightly better but weakness is still the same. He feels he went from a zero to 35% with just one dose. I do think that if he continues to  demonstrate improvement, we can switch him to PO Prednisone 1250mg  daily from IV Methyl prednisone to finish the rest of the course. I would hold off on additional immunomodulatory therapies like PLEX given improvement with just single dose Prednisone but that is an option too. Agree with PT and OT evaluation, specifically to see if he would benefit from inpatient rehab. Follow up pending Copper, Zinc, Mog Ab, NMO panel, Enterovirus. Continue B12 replacement along with multivitamin PO.   , MD [facility] Erick Blinks   If 7pm to 7am, please call on call as listed on AMION.

## 2020-06-28 NOTE — Evaluation (Signed)
Physical Therapy Evaluation & Discharge Patient Details Name: Hunter Bell MRN: [id number] DOB: 1991-03-22 Today's Date: 06/28/2020   History of Present Illness  Pt is a 30 y.o. male admitted 06/26/20 with c/o LUE numbness, which started 9 days prior, and spread over chest/trunk and BLEs, as well as LUE/BLE weakness. MRI with no acute intracranial abnormality. 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 Pt started on high-dose steroids and B12 repletion. No PMH on file.    Clinical Impression  Patient evaluated by Physical Therapy with no further acute PT needs identified. PTA, pt independent, works, drives, lives with wife and two young children. Today, pt independent with ambulation, has some difficulty performing ADL tasks secondary to L wrist/hand impairments, including decreased sensation and strength. Pt reports "pins and needles" feeling throughout his trunk and extremities, most notable in LUE and proximal legs. BLE strength 5/5, although pt with decreased light touch sensation. Dynamic Gait Index score of 23/24 does not indicate risk for falls with higher level balance tasks. Pt would likely benefit from outpatient OT needs to address UE deficits. All education has been completed and the patient has no further questions. Acute PT is signing off. Thank you for this referral.    Follow Up Recommendations No PT follow up - discussed benefits of outpatient neuro OT to address LUE deficits    Equipment Recommendations  None recommended by PT    Recommendations for Other Services       Precautions / Restrictions Precautions Precautions: Fall Restrictions Weight Bearing Restrictions: No      Mobility  Bed Mobility Overal bed mobility: Independent                  Transfers Overall transfer level: Independent Equipment used: None                 Ambulation/Gait Ambulation/Gait assistance: Independent Gait Distance (Feet): 500 Feet Assistive device: None Gait Pattern/deviations: WFL(Within Functional Limits)   Gait velocity interpretation: 1.31 - 2.62 ft/sec, indicative of limited community ambulator General Gait Details: Independent ambulating in room and hallway; no overt instability or LOB; pt endorses feeling slightly unsteady with horizontal head turns, did not observe instability  Stairs            Wheelchair Mobility    Modified Rankin (Stroke Patients Only)       Balance Overall balance assessment: Independent   Sitting balance-Leahy Scale: Normal Sitting balance - Comments: Able to don bilateral socks sitting EOB despite difficulty managing with L hand     Standing balance-Leahy Scale: Good                   Standardized Balance Assessment Standardized Balance Assessment : Dynamic Gait Index   Dynamic Gait Index Level Surface: Normal Change in Gait Speed: Normal Gait with Horizontal Head Turns: Normal Gait with Vertical Head Turns: Normal Gait and Pivot Turn: Normal Step Over Obstacle: Mild Impairment Step Around Obstacles: Normal Steps: Normal Total Score: 23       Pertinent Vitals/Pain Pain Assessment: No/denies pain    Home Living Family/patient expects to be discharged to:: Private residence Living Arrangements: Spouse/significant other;Children Available Help at Discharge: Family;Available PRN/intermittently Type of Home: House Home Access: Stairs to enter Entrance Stairs-Rails: Right;Left Entrance Stairs-Number of Steps: 6 Home Layout: Two level;Able to live on main level with bedroom/bathroom Home Equipment: Grab bars - tub/shower Additional Comments: Lives with wife  and two kids (9 mo, 29 yo)    Prior Function Level of Independence: Independent         Comments: Works, drives; enjoys watching sports and playing video games     Hand Dominance   Dominant Hand:  Right    Extremity/Trunk Assessment   Upper Extremity Assessment Upper Extremity Assessment: RUE deficits/detail;LUE deficits/detail RUE Deficits / Details: Strength 5/5; reports "pins and needles" feeling throughout arm, but not as significant as LUE LUE Deficits / Details: L shoulder and elbow 5/5 strength and AROM WFL; L wrist 3/5, L finger weakness with significant difficulties with fine motor control; pt reports "my thumb feels most foreign" - sensation of a 'lump' in thenar eminence; "pins and needles" throughout extremity, especially hand; pt reports feeling pressure, but difficulty determining pain/sensation beyond feeling pressure LUE Sensation: decreased light touch LUE Coordination: decreased fine motor    Lower Extremity Assessment Lower Extremity Assessment: RLE deficits/detail;LLE deficits/detail RLE Deficits / Details: Strength 5/5 throughout; reports pins and needles sensation improving ("I can finally feel my toes today"), although still feels it throughout BLEs ("it gets more significant the higher up (my leg) you go"); appears symmetrical with BLEs RLE Sensation: decreased light touch LLE Deficits / Details: Strength 5/5 throughout; reports pins and needles sensation improving ("I can finally feel my toes today"), although still feels it throughout BLEs ("it gets more significant the higher up (my leg) you go"); appears symmetrical with BLEs LLE Sensation: decreased light touch    Cervical / Trunk Assessment Cervical / Trunk Assessment: Normal (reports persistent numbness/tingling in trunk/sacral/perineal area, although improving; still with difficulty sensing need for bladder/bowel movement)  Communication   Communication: No difficulties  Cognition Arousal/Alertness: Awake/alert Behavior During Therapy: WFL for tasks assessed/performed Overall Cognitive Status: Within Functional Limits for tasks assessed                                         General Comments General comments (skin integrity, edema, etc.): Encouraged continued attempts to use LUE as much as possible (pt already doing a good job of this despite difficulties, especially with fine motor control and decreased sensation). Discussed likely benefit from outpatient neuro OT    Exercises     Assessment/Plan    PT Assessment Patent does not need any further PT services  PT Problem List         PT Treatment Interventions      PT Goals (Current goals can be found in the Care Plan section)  Acute Rehab PT Goals Patient Stated Goal: Regain function of LUE PT Goal Formulation: All assessment and education complete, DC therapy    Frequency     Barriers to discharge        Co-evaluation               AM-PAC PT "6 Clicks" Mobility  Outcome Measure Help needed turning from your back to your side while in a flat bed without using bedrails?: None Help needed moving from lying on your back to sitting on the side of a flat bed without using bedrails?: None Help needed moving to and from a bed to a chair (including a wheelchair)?: None Help needed standing up from a chair using your arms (e.g., wheelchair or bedside chair)?: None Help needed to walk in hospital room?: None Help needed climbing 3-5 steps with a railing? : None 6 Click Score:  24    End of Session   Activity Tolerance: Patient tolerated treatment well Patient left: in chair;with call bell/phone within reach Nurse Communication: Mobility status PT Visit Diagnosis: Other abnormalities of gait and mobility (R26.89);Other symptoms and signs involving the nervous system (R29.898)    Time: 3785-8850 PT Time Calculation (min) (ACUTE ONLY): 25 min   Charges:   PT Evaluation $PT Eval Low Complexity: 1 Low     Hunter Bell, PT, DPT Acute Rehabilitation Services  Pager [phone] Office [phone]  Hunter Bell 06/28/2020, 12:49 PM

## 2020-06-29 ENCOUNTER — Other Ambulatory Visit (HOSPITAL_COMMUNITY): Payer: Self-pay

## 2020-06-29 DIAGNOSIS — G373 Acute transverse myelitis in demyelinating disease of central nervous system: Secondary | ICD-10-CM

## 2020-06-29 DIAGNOSIS — E538 Deficiency of other specified B group vitamins: Secondary | ICD-10-CM

## 2020-06-29 LAB — COMPREHENSIVE METABOLIC PANEL
ALT: 53 U/L — ABNORMAL HIGH (ref 0–44)
AST: 24 U/L (ref 15–41)
Albumin: 3.9 g/dL (ref 3.5–5.0)
Alkaline Phosphatase: 68 U/L (ref 38–126)
Anion gap: 10 (ref 5–15)
BUN: 16 mg/dL (ref 6–20)
CO2: 22 mmol/L (ref 22–32)
Calcium: 9.2 mg/dL (ref 8.9–10.3)
Chloride: 103 mmol/L (ref 98–111)
Creatinine, Ser: 1.19 mg/dL (ref 0.61–1.24)
GFR, Estimated: >60 mL/min (ref >60)
Glucose, Bld: 189 mg/dL — ABNORMAL HIGH (ref 70–99)
Potassium: 4 mmol/L (ref 3.5–5.1)
Sodium: 135 mmol/L (ref 135–145)
Total Bilirubin: 0.6 mg/dL (ref 0.3–1.2)
Total Protein: 7.4 g/dL (ref 6.5–8.1)

## 2020-06-29 LAB — VITAMIN D 25 HYDROXY (VIT D DEFICIENCY, FRACTURES): Vit D, 25-Hydroxy: 12.35 ng/mL — ABNORMAL LOW (ref 30–100)

## 2020-06-29 MED ORDER — VITAMIN D (ERGOCALCIFEROL) 1.25 MG (50000 UNIT) PO CAPS
50000.0000 [IU] | ORAL_CAPSULE | ORAL | 0 refills | Status: AC
  Filled 2020-06-29: qty 4, 28d supply, fill #0

## 2020-06-29 MED ORDER — PANTOPRAZOLE SODIUM 40 MG PO TBEC
40.0000 mg | DELAYED_RELEASE_TABLET | Freq: Every day | ORAL | 0 refills | Status: AC
  Filled 2020-06-29: qty 30, 30d supply, fill #0

## 2020-06-29 MED ORDER — SODIUM CHLORIDE 0.9 % IV SOLN
1000.0000 mg | INTRAVENOUS | Status: DC
  Administered 2020-06-29: 1000 mg via INTRAVENOUS
  Filled 2020-06-29: qty 8

## 2020-06-29 MED ORDER — PREDNISONE 50 MG PO TABS
1250.0000 mg | ORAL_TABLET | Freq: Every day | ORAL | 0 refills | Status: DC
  Filled 2020-06-29: qty 50, 10d supply, fill #0

## 2020-06-29 MED ORDER — CYANOCOBALAMIN 1000 MCG PO TABS
1000.0000 ug | ORAL_TABLET | Freq: Every day | ORAL | 0 refills | Status: AC
Start: 2020-06-30 — End: ?
  Filled 2020-06-29: qty 30, 30d supply, fill #0

## 2020-06-29 NOTE — Discharge Summary (Addendum)
Name: Hunter Bell MRN: [id number] DOB: [birth year] 30 y.o. PCP: Patient, No Pcp Per (Inactive) Referred to our [facility] clinic to establish care  Date of Admission: 06/26/2020  9:47 PM Date of Discharge:  06/29/20 Attending Physician: Reymundo Poll, MD   Discharge Diagnosis: Transverse myelitis Vitamin B12 deficiency Vitamin D deficiency  Discharge Medications: Allergies as of 06/29/2020   No Known Allergies      Medication List     TAKE these medications    cyanocobalamin 1000 MCG tablet Take 1 tablet (1,000 mcg total) by mouth daily. Start taking on: June 30, 2020   pantoprazole 40 MG tablet Commonly known as: PROTONIX Take 1 tablet (40 mg total) by mouth daily.   predniSONE 50 MG tablet Commonly known as: DELTASONE Take 25 tablets (1,250 mg total) by mouth daily with lunch for 2 days.   Vitamin D (Ergocalciferol) 1.25 MG (50000 UNIT) Caps capsule Commonly known as: DRISDOL Take 1 capsule (50,000 Units total) by mouth every 7 (seven) days.        Disposition and follow-up:   Mr.Hunter Bell was discharged from [facility] in Good condition.  At the hospital follow up visit please address:  1.  Follow up: Transverse myelitis-discharging today to finish 5-day course of high-dose steroids with p.o. prednisone, referred for outpatient occupational therapy, assess for improvement of symptoms, follow-up pending labs Vitamin B12 deficiency-continue oral 1000 mcg daily Vitamin D deficiency-start weekly 50,000 units for 8 weeks, follow-up with PCP to transition to daily medication Transaminitis-mild isolated elevation in ALT which is improving by time of discharge, follow-up CMP and consider further liver testing if needed  2.  Labs / imaging needed at time of follow-up: Vitamin D, CMP  3.  Pending labs/ test needing follow-up: Enterovirus PCR, zinc, copper, antimyelin associated glycoprotein, NMO IgG  Follow-up Appointments:  Follow-up Information      [facility] Follow up.   Contact information: [address] [place] [address] [phone]                Hospital Course by problem list:  #Cervical spine demyelination w/ focal weakness, paraesthesias  #B12, Vitamin D Deficiencies Patient with no medical history presented on 04/02 with numbness in his left arm that started on 03/24. Symptoms began with a burning sensation to light touch, progressing to pins-and-needles then numbness. Numbness spread over his chest, trunk, and bilateral legs. Also reports some weakness of left arm, bilateral legs. During admission, MRI of Cervical Spine w/wo contrast demonstrated hyperintensity of dorsal cord at C4-C5 with irregular enhancement, consistent with active demyelination. Also noted to have a low B12 level (158). 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 Received 3 days of IV high-dose steroids (Solu Medrol 1000 mg IV, daily) and high-dose oral Vitamin B12 (1000 mcg, daily). Symptoms significantly improved over the course of hospitalization, with significant improvement of weakness and numbness. Instructed to complete course of steroids in the outpatient setting (2 days remaining) prior to discharge. Continue with oral prednisone outpatient, protonix, B12, vitamin D. Follow up with neuro outpatient with Dr. Epimenio Foot. Referred for outpatient occupational therapy. [facility]   Subjective on day of discharge: This morning, patient notes continued improvement of his numbness and weakness. States that he feels twice as good as he felt yesterday. Notes reduced pins-and-needles sensation of distal extremities. Reports improved mobility and balance, stating that he can finally once again feel  his legs beneath him. Has been able to use the restroom without difficulty. Wishes to go home today as his wife has been missing work for  childcare. Agreeable to finishing course of high dose steroids orally at home.   Discharge Exam:   BP (!) 146/78 (BP Location: Left Arm)   Pulse 95   Temp 98.4 F (36.9 C) (Oral)   Resp 16   Wt 108.4 kg   SpO2 96%   BMI 31.53 kg/m  Discharge exam: General: Alert, awake; no acute distress; lying comfortably in bed Cardiovascular: Regular rate, rhythm; no murmurs, rubs, gallops  Neurological: Patient able to appreciate light touch throughout face, upper, and lower extremities but notes that sensation decreased on left upper extremity. LUE strength 4/5 but improved compared to yesterday. Bilateral lower extremities 5/5 which is improved from prior. Cranial nerves grossly intact.  Extremities: No edema, cyanosis, erythema  Skin: Warm, dry   Pertinent Labs, Studies, and Procedures:  MR BRAIN W WO CONTRAST  Result Date: 06/27/2020 CLINICAL DATA:  Multiple sclerosis suspected. EXAM: MRI HEAD WITHOUT AND WITH CONTRAST TECHNIQUE: Multiplanar, multiecho pulse sequences of the brain and surrounding structures were obtained without and with intravenous contrast. CONTRAST:  39mL GADAVIST GADOBUTROL 1 MMOL/ML IV SOLN COMPARISON:  None. FINDINGS: Brain: No acute infarction, hemorrhage, hydrocephalus, extra-axial collection or mass lesion. No visible white matter lesions. No abnormal enhancement or restricted diffusion. Vascular: Major arterial flow voids are maintained at the skull base. Skull and upper cervical spine: Normal marrow signal. Please see concurrent MRI of the cervical spine for evaluation of a cord lesion. Sinuses/Orbits: Mild inferior maxillary sinuses and ethmoid air cell mucosal thickening. Other: No mastoid effusions. IMPRESSION: 1. No acute intracranial abnormality. No visible white matter lesions or abnormal enhancement. 2. Please see concurrent MRI of the cervical spine for evaluation of a cord lesion. Electronically Signed   By: Feliberto Harts MD   On: 06/27/2020 16:12   MR CERVICAL  SPINE W WO CONTRAST  Result Date: 06/27/2020 CLINICAL DATA:  Suspicion for multiple sclerosis. Check for lesions. EXAM: MRI CERVICAL SPINE WITHOUT AND WITH CONTRAST TECHNIQUE: Multiplanar and multiecho pulse sequences of the cervical spine, to include the craniocervical junction and cervicothoracic junction, were obtained without and with intravenous contrast. CONTRAST:  63mL GADAVIST GADOBUTROL 1 MMOL/ML IV SOLN COMPARISON:  None. FINDINGS: Motion limited evaluation. Alignment: Straightening of the normal cervical lordosis. No substantial sagittal subluxation. Vertebrae: Vertebral body heights are maintained. No specific evidence of acute fracture, discitis/osteomyelitis, or suspicious bone lesion. Cord: At C4-C5 there is a short segment area of T2/stir hyperintensity within the midline dorsal cord which demonstrates irregular enhancement. This area is mildly expanded. Posterior Fossa, vertebral arteries, paraspinal tissues: Posterior fossa is further characterized on concurrent MRI head. Vertebral artery flow voids are maintained. Disc levels: C2-C3: Mild uncovertebral hypertrophy without evidence of significant canal or foraminal stenosis. C3-C4: Motion limited evaluation no significant canal stenosis. Bilateral uncovertebral hypertrophy with possible mild bilateral foraminal stenosis. C4-C5: Motion limited evaluation. Bilateral uncovertebral hypertrophy without significant canal stenosis. Possible mild left foraminal stenosis. C5-C6: No significant disc protrusion, foraminal stenosis, or canal stenosis. C6-C7: No significant disc protrusion, foraminal stenosis, or canal stenosis. C7-T1: No significant disc protrusion, foraminal stenosis, or canal stenosis. IMPRESSION: 1. Mildly expansile short-segment T2/STIR hyperintensity within the dorsal cord at C4-C5 with irregular enhancement. Findings are most consistent with active demyelination given the characteristic appearance and the provided clinical history.  Infection, transverse myelitis, or tumor are less likely differential considerations. 2. Motion  limited evaluation with possibly mild foraminal stenosis bilaterally at C3-C4 and on the left at C4-C5. No significant canal stenosis. Repeat study (possibly with sedation) could better characterize the foramina if clinically indicated. Electronically Signed   By: Feliberto Harts MD   On: 06/27/2020 15:50    Recent Labs    06/27/20 1255 06/27/20 2021 06/28/20 0251 06/28/20 0251 06/29/20 0744  AST  --   --  37  --  24  ALT  --   --  65*   < > 53*  BILITOT  --   --  0.9   < > 0.6  ALKPHOS  --   --  81   < > 68  VITAMINB12 158*  --   --   --   --   LABRPR  --  NON REACTIVE  --   --   --   HIV  --  NON REACTIVE  --   --   --    < > = values in this interval not displayed.     Discharge Instructions: Discharge Instructions     Ambulatory referral to Neurology   Complete by: As directed    An appointment is requested in approximately 2-4 weeks for demyelinating lesion of C-spine / transverse myelitis s/p 5 days of high-dose steroids   Ambulatory referral to Occupational Therapy   Complete by: As directed    Diet - low sodium heart healthy   Complete by: As directed    Discharge instructions   Complete by: As directed    Mr. Ritacco, is was a pleasure taking care of you. You were admitted for transverse myelitis and B12 deficiency. The following are your discharge instructions.  1. START prednisone, 1250mg  (25 tablets) for 2 days, take this with a meal, this is the rest of your steroids 2. START protonix. I saw you have had this before, I have sent a new prescription. Steroids can increase reflux, so take this for a few days. 3. START vitamin B12, daily (1 tablet) 4. START vitamin D, 50,000 units WEEKLY for 8 weeks, follow up with your PCP to check level and change to daily dosing  5. Follow up with neurology, they will schedule an appointment with you 6. Our clinic will call you  to schedule an appointment to follow up. You may establish with a PCP if you wish. We also have a [place] and social work team who may help you navigate your insurance situation   Increase activity slowly   Complete by: As directed        Signed: Artist, MD 06/29/2020, 2:18 PM   Pager: [phone]

## 2020-06-29 NOTE — Progress Notes (Signed)
Neurology Progress Note Subjective: Patient reports subjective improvement since presentation to be 55-60%  Reports improved sensation in his abdomen, left foot, and left hand. Notes improvement in motor function of left hand. Also notes improvement in sensation with voiding and with bowel movements. Expressed concern with staying in hospital due to his spouse missing work for childcare. Expressed concern for availability with outpatient infusion clinic due to family responsibilities.    Exam: Vitals:   06/28/20 2305 06/29/20 0438  BP: 133/78 134/75  Pulse: (!) 103 90  Resp: 18 16  Temp: 98.3 F (36.8 C) 97.9 F (36.6 C)  SpO2: 93% 93%   Gen: Awake, in bed, in no acute distress Resp: non-labored breathing, no respiratory distress Abd: soft, non-distended  Neuro: MS: alert and oriented to person, place, time, and situation. Speech is intact without aphasia or dysarthria. Patient is able to give a clear and coherent history of present illness.  CN: Lids elevate symmetrically, face is symmetric resting and smiling, hearing is intact to voice, phonation normal, shoulder shrug symmetric, tongue protrudes midline, visual fields full.  Motor: Hoffman's sign present on the left hand, absent on the right. Fine motor control impaired on the left hand. Strength in the left hand and fingers improved with some residual weakness. Left hand grip strength, wrist, finger abduction and adduction 4/5 strength.  Tone and bulk are normal.  Sensory: Proprioception intact on the left great toe but is not intact on the left thumb. Sensation improved in the left foot and hand but still with reported decreased sensation to light touch. "Heavy" feeling of abdomen resolved with improved sensation. Improvement in sensation with voiding and sphincter control with defecation.  DTR: 3+ and symmetric throughout.    Pertinent Labs: Lab Results  Component Value Date   VITAMINB12 158 (L) 06/27/2020  CBC    Component  Value Date/Time   WBC 8.3 06/28/2020 0251   RBC 5.70 06/28/2020 0251   HGB 17.4 (H) 06/28/2020 0251   HCT 50.5 06/28/2020 0251   PLT 304 06/28/2020 0251   MCV 88.6 06/28/2020 0251   MCH 30.5 06/28/2020 0251   MCHC 34.5 06/28/2020 0251   RDW 12.9 06/28/2020 0251   LYMPHSABS 0.9 06/28/2020 0251   MONOABS 0.1 06/28/2020 0251   EOSABS 0.0 06/28/2020 0251   BASOSABS 0.0 06/28/2020 0251  CMP     Component Value Date/Time   NA 135 06/29/2020 0744   K 4.0 06/29/2020 0744   CL 103 06/29/2020 0744   CO2 22 06/29/2020 0744   GLUCOSE 189 (H) 06/29/2020 0744   BUN 16 06/29/2020 0744   CREATININE 1.19 06/29/2020 0744   CALCIUM 9.2 06/29/2020 0744   PROT 7.4 06/29/2020 0744   ALBUMIN 3.9 06/29/2020 0744   AST 24 06/29/2020 0744   ALT 53 (H) 06/29/2020 0744   ALKPHOS 68 06/29/2020 0744   BILITOT 0.6 06/29/2020 0744   GFRNONAA >60 06/29/2020 0744  HIV non reactive     TSH normal RPR nonreavtive  Imaging MD has reviewed images in epic and the results pertinent to this consultation are:  MRI brainand Cspine with and without contast Brain: 1. No acute intracranial abnormality. No visible white matter lesions or abnormal enhancement. Cspine: 1. Mildly expansile short-segment T2/STIR hyperintensity within the dorsal cord at C4-C5 with irregular enhancement. Findings are most consistent with active demyelination given the characteristic appearance and the provided clinical history. Infection, transverse myelitis, or tumor are less likely differential considerations. 2. Motion limited evaluation with possibly mild foraminal  stenosis bilaterally at C3-C4 and on the left at C4-C5. No significant canal stenosis. Repeat study (possibly with sedation) could better characterize the foramina if clinically indicated.  Assessment: 30 yo male who presented yesterday with 10 days of paraesthesias with progression of symptoms. On initial exam, suspicion for MS was on differential. After the finding of  demyelination and lesion at C4-C5, we believe this to be transverse myelitis. Patient is improving although MD thinks it is less than patient's rating of 35%. He will need PT/OT and opinion on long term therapy. He is day 2 of high dose steroids. Will see what PT/OT thinks about rehab-in patient vs out patient and patient may be able to go home in a couple of days as long as he continues to improve. He will need high dose po steroids when he goes home. He admits to poor nutrition and his B12 level was very low. He has been started on Vit B12 high dose and MVI.  Impression: Demyelination of C-spine / Transverse Myelitis  Recommendations: - Last IV steroid planned for today at 11:00 (treatment 3 of 5) - Okay to transition to PO Prednisone 1,250 mg daily for 2 days with pantoprazole 40 mg PO daily while on PO steroids - Follow up with Copper, Zinc, anti-Mog Ab, NMO panel, Enterovirus outpatient - Continue B12 replacement 1,000 mcg PO daily, recheck Vitamin B12 level in one month - Follow up with Dr. Epimenio Foot with [facility] outpatient in 2-4 weeks   Lanae Boast, AGACNP-BC [facility] [phone]  Attending Neurohospitalist Addendum Patient seen and examined with APP/Resident. Agree with the history and physical as documented above. Agree with the plan as documented, which I helped formulate. I have independently reviewed the chart, obtained history, review of systems and examined the patient.I have personally reviewed pertinent head/neck/spine imaging (CT/MRI). Discussed the plan with the patient in detail at his bedside.  Answered all his questions. Please feel free to call with any questions.  -- Milon Dikes, MD Neurologist [facility] Pager: [phone]

## 2020-06-29 NOTE — Hospital Course (Signed)
Subjective:   This morning, patient notes continued resolution of his numbness and weakness. States that he feels twice as good as he felt yesterday. Notes reduced pins-and-needles sensation of distal extremities. Reports improved mobility and balance, stating that he can finally once again feel his legs beneath him. Has been able to use the restroom without difficulty. Wishes to go home today as his wife has been missing work for childcare. Agreeable to finishing course of high dose steroids orally at home.   Objective:   Vital signs in last 24 hours:       Vitals:    06/28/20 1655 06/28/20 1700 06/28/20 2305 06/29/20 0438  BP: 130/90   133/78 134/75  Pulse: (!) 112 (!) 101 (!) 103 90  Resp: 17   18 16   Temp: 98.3 F (36.8 C)   98.3 F (36.8 C) 97.9 F (36.6 C)  TempSrc: Oral   Oral Oral  SpO2: 96%   93% 93%    Physical Exam:  General: Alert, awake; no acute distress; lying comfortably in bed Cardiovascular: Regular rate, rhythm; no murmurs, rubs, gallops  Neurological: Normal sensation to light touch, bilateral upper and lower extremities; reduced strength, left upper extremity Extremities: No edema, cyanosis, erythema  Skin: Warm, dry   Labs in Last 24 Hours:  - Vitamin D: 12.35   Pending:  - Enterovirus - Anti-myelin IgG, neuromyelitis optica IgG  Hospital Course:   #Cervical spine demyelination w/ focal weakness, paraesthesias  #B12, Vitamin D Deficiencies Patient with no medical history presented on 04/02 with numbness in his left arm that started on 03/24. Symptoms began with a burning sensation to light touch, progressing to pins-and-needles then numbness. Numbness spread over his chest, trunk, and bilateral legs. Also reports some weakness of left arm, bilateral legs. During admission, MRI of Cervical Spine w/wo contrast demonstrated hyperintensity of dorsal cord at C4-C5 with irregular enhancement, consistent with active demyelination. Also noted to have a low B12  level (158). 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 Received 3 days of high-dose steroids (Solu Medrol 1000 mg IV, daily) and high-dose oral Vitamin B12 (1000 mcg, daily). Symptoms significantly improved over the course of hospitalization, with near total resolution of weakness and numbness. Instructed to complete course of steroids in the outpatient setting (2 days remaining) prior to discharge.  - Transition to prednisone 1250 mg PO daily for 2 days with pantoprazole 40 mg PO daily while taking steroids  - Continue B12 replacement (1000 mcg PO, daily) and recheck levels in one month  - Start 50,000 IU Vitamin D weekly for 8 weeks then transition to 800 IU Vitamin D daily  - f/u with Dr. 4/24 (Neurology) in outpatient in 2-4 weeks  - PT/OT evaluated with recommendation for outpatient OT to improved lingering, minor LUE deficits  - [facility]

## 2020-06-29 NOTE — Progress Notes (Incomplete)
     Subjective:  Interval History:   - 30 y.o. male with no medical history who presented on 04/02 with numbness in his left arm that started on 03/24. Started with burning sensation to light touch, progressing to pins-and-needles then numbness. Numbness spread over his chest, trunk, and bilateral legs. Also reports some weakness of left arm, bilateral legs. No bladder or bowel incontinence but notes having to be very careful.   - MRI Cervical Spine w/wo Contrast: hyperintensity within dorsal cord at C4-C5 with irregular enhancement, consistent with active demyelination.   - Also noted to have a low B12 level (158)  - Neurology consulted, initially concerned for possible MS given clinical picture but, based on imaging, determined that idiopathic transverse myelitis was more likely.   - Started on high-dose steroids (Solu Medrol 1000 mg IV, daily) on 04/02 and high-dose oral Vitamin B12 (1000 mcg, daily).  - Improved overnight, noting that he felt 30% better yesterday. Possible discharge to home with high-dose steroids for 5 day course pending PT/OT evaluation.   Yesterday afternoon, patient was evaluated by PT/OT who recommend home OT to address LUE deficits. No home equipment recommendations.   This morning, ***  Objective:  Vital signs in last 24 hours: Vitals:   06/28/20 1655 06/28/20 1700 06/28/20 2305 06/29/20 0438  BP: 130/90  133/78 134/75  Pulse: (!) 112 (!) 101 (!) 103 90  Resp: 17  18 16   Temp: 98.3 F (36.8 C)  98.3 F (36.8 C) 97.9 F (36.6 C)  TempSrc: Oral  Oral Oral  SpO2: 96%  93% 93%   Labs in Last 24 Hours:  Pending:  - Vitamin D  - Enterovirus - Anti-myelin IgG, neuromyelitis optica IgG   Physical Exam Constitutional:  Head:  ENT: Eyes:  Pulmonary:  Abdominal:  Skin:  Neurological: alert, cranial nerves grossly intact. Decrease sensation over left arm, trunk, bilateral lower extremities. Left upper extremity strength 4 out of 5 but seems mildly  improved compared to yesterday.  Psychiatric:  Assessment/Plan: Hunter Bell is a 30 y.o. male with no contributing medical history presenting for paresthesia and focal weakness.  MRI of C-spine with area of T2 hyperintensity, also B12 deficient.  Starting high-dose steroids and B12 repletion per neurology.  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  No lesions on the brain.  MS is a possibility as well as other demyelinating disease, and B12 deficiency. Also ruling out viral myelitis. B12 level of 158.  He does not follow any particular diet, no surgical history or other history suggesting malabsorption. - Neurology following - Continue high-dose steroids, Solu-Medrol 1000 mg IV daily, day 2/5 - Follow-up enterovirus, West Nile, RPR, copper, zinc, NML, anti-MOG Ab - Follow vitamin D - s/p vitamin B12 1000 mg IM x1, continue with 1000 mg p.o. daily indefinitely  Diet:  regular IVF:  none VTE:  lovenox Prior to Admission Living Arrangement:  home Anticipated Discharge Location:  home Barriers to Discharge:  Clinical improvement Dispo: Anticipated discharge in approximately 1-2 day(s).   26, Medical Student 06/29/2020, 6:44 AM Pager: [phone] After 5pm on weekdays and 1pm on weekends: On Call pager [phone]

## 2020-06-29 NOTE — TOC Initial Note (Signed)
Transition of Care [facility]) - Initial/Assessment Note    Patient Details  Name: Hunter Bell MRN: [id number] Date of Birth: 02-06-1991  [facility]) CM/SW Contact:    Kingsley Plan, RN Phone Number: 06/29/2020, 10:34 AM  Clinical Narrative:                 Spoke to patient at bedside. He just started a new job, his new insurance is not active yet.   NCM will assist with discharge prescriptions through [facility] and [facility].   Provided information on [facility] , they require patient to call himself to schedule an appointment.   Discussed OP OT , order placed for AP , they will call to arrnage a visit. NCM confirmed patient's contact information.   Expected Discharge Plan: Home/Self Care     Patient Goals and CMS Choice Patient states their goals for this hospitalization and ongoing recovery are:: to return to home CMS Medicare.gov Compare Post Acute Care list provided to:: Patient Choice offered to / list presented to : Patient  Expected Discharge Plan and Services Expected Discharge Plan: Home/Self Care In-house Referral: Financial Counselor Discharge Planning Services: CM Consult,Medication [facility] Post Acute Care Choice:  (OP OT) Living arrangements for the past 2 months: Single Family Home                 DME Arranged: N/A         HH Arranged: NA          Prior Living Arrangements/Services Living arrangements for the past 2 months: Single Family Home Lives with:: Spouse Patient language and need for interpreter reviewed:: Yes Do you feel safe going back to the place where you live?: Yes      Need for Family Participation in Patient Care: Yes (Comment) Care giver support system in place?: Yes (comment)   Criminal Activity/Legal Involvement Pertinent to Current Situation/Hospitalization: No - Comment as needed  Activities of Daily Living Home Assistive Devices/Equipment: None ADL Screening  (condition at time of admission) Patient's cognitive ability adequate to safely complete daily activities?: Yes Is the patient deaf or have difficulty hearing?: No Does the patient have difficulty seeing, even when wearing glasses/contacts?: No Does the patient have difficulty concentrating, remembering, or making decisions?: No Patient able to express need for assistance with ADLs?: Yes Does the patient have difficulty dressing or bathing?: No Independently performs ADLs?: Yes (appropriate for developmental age) Does the patient have difficulty walking or climbing stairs?: Yes Weakness of Legs: Both Weakness of Arms/Hands: Left  Permission Sought/Granted   Permission granted to share information with : No              Emotional Assessment Appearance:: Appears stated age Attitude/Demeanor/Rapport: Engaged Affect (typically observed): Accepting Orientation: : Oriented to Self,Oriented to Place,Oriented to  Time,Oriented to Situation Alcohol / Substance Use: Not Applicable Psych Involvement: No (comment)  Admission diagnosis:  Paresthesia [R20.2] Acute muscle weakness [M62.81] Patient Active Problem List   Diagnosis Date Noted  . 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 Vitamin B12 deficiency 06/28/2020  . Acute muscle weakness 06/27/2020   PCP:  Patient, No Pcp Per (Inactive) Pharmacy:   [facility] [phone] - [place], [place] - [address] AT [facility] [address] [place] [address] Phone: [phone] Fax: [phone]  [facility] - [place], [place] - [address] [place]  [place] [address] Phone: [phone] Fax: [phone]  [facility] [address] [place] [address] Phone: [phone] Fax: [phone]     Social Determinants of Health (SDOH) Interventions    Readmission Risk Interventions No flowsheet  data found.

## 2020-06-29 NOTE — Progress Notes (Signed)
Discharge teaching complete. Meds, diet, activity, follow up appointments reviewed and all questions answered. Copy of instructions given to patient and meds brought to bedside by TOC pharmacy.  

## 2020-07-01 LAB — NEUROMYELITIS OPTICA AUTOAB, IGG: NMO-IgG: <1.5 U/mL (ref 0.0–3.0)

## 2020-07-02 LAB — COPPER, SERUM: Copper: 98 ug/dL (ref 63–121)

## 2020-07-02 LAB — ZINC: Zinc: 95 ug/dL (ref 44–115)

## 2020-07-05 ENCOUNTER — Encounter (HOSPITAL_COMMUNITY): Payer: Self-pay | Admitting: Emergency Medicine

## 2020-07-05 ENCOUNTER — Inpatient Hospital Stay (HOSPITAL_COMMUNITY): Payer: Self-pay

## 2020-07-05 ENCOUNTER — Other Ambulatory Visit: Payer: Self-pay

## 2020-07-05 ENCOUNTER — Inpatient Hospital Stay (HOSPITAL_COMMUNITY)
Admission: EM | Admit: 2020-07-05 | Discharge: 2020-07-07 | DRG: 099 | Disposition: A | Discharge status: Home / Self Care | Payer: Self-pay | Attending: Family Medicine | Admitting: Family Medicine

## 2020-07-05 DIAGNOSIS — Z20822 Contact with and (suspected) exposure to covid-19: Secondary | ICD-10-CM | POA: Diagnosis present

## 2020-07-05 DIAGNOSIS — E538 Deficiency of other specified B group vitamins: Secondary | ICD-10-CM | POA: Diagnosis present

## 2020-07-05 DIAGNOSIS — Z5309 Procedure and treatment not carried out because of other contraindication: Secondary | ICD-10-CM | POA: Diagnosis not present

## 2020-07-05 DIAGNOSIS — R29898 Other symptoms and signs involving the musculoskeletal system: Secondary | ICD-10-CM

## 2020-07-05 DIAGNOSIS — R200 Anesthesia of skin: Secondary | ICD-10-CM

## 2020-07-05 DIAGNOSIS — E559 Vitamin D deficiency, unspecified: Secondary | ICD-10-CM | POA: Diagnosis present

## 2020-07-05 DIAGNOSIS — G373 Acute transverse myelitis in demyelinating disease of central nervous system: Principal | ICD-10-CM | POA: Diagnosis present

## 2020-07-05 DIAGNOSIS — R2990 Unspecified symptoms and signs involving the nervous system: Secondary | ICD-10-CM | POA: Diagnosis present

## 2020-07-05 DIAGNOSIS — G379 Demyelinating disease of central nervous system, unspecified: Secondary | ICD-10-CM

## 2020-07-05 LAB — COMPREHENSIVE METABOLIC PANEL
ALT: 53 U/L — ABNORMAL HIGH (ref 0–44)
AST: 22 U/L (ref 15–41)
Albumin: 3.9 g/dL (ref 3.5–5.0)
Alkaline Phosphatase: 63 U/L (ref 38–126)
Anion gap: 11 (ref 5–15)
BUN: 17 mg/dL (ref 6–20)
CO2: 26 mmol/L (ref 22–32)
Calcium: 8.7 mg/dL — ABNORMAL LOW (ref 8.9–10.3)
Chloride: 100 mmol/L (ref 98–111)
Creatinine, Ser: 1.2 mg/dL (ref 0.61–1.24)
GFR, Estimated: >60 mL/min (ref >60)
Glucose, Bld: 104 mg/dL — ABNORMAL HIGH (ref 70–99)
Potassium: 3.5 mmol/L (ref 3.5–5.1)
Sodium: 137 mmol/L (ref 135–145)
Total Bilirubin: 0.7 mg/dL (ref 0.3–1.2)
Total Protein: 7.1 g/dL (ref 6.5–8.1)

## 2020-07-05 LAB — CBC WITH DIFFERENTIAL/PLATELET
Abs Immature Granulocytes: 0.19 10*3/uL — ABNORMAL HIGH (ref 0.00–0.07)
Basophils Absolute: 0 10*3/uL (ref 0.0–0.1)
Basophils Relative: 0 %
Eosinophils Absolute: 0.3 10*3/uL (ref 0.0–0.5)
Eosinophils Relative: 2 %
HCT: 50.5 % (ref 39.0–52.0)
Hemoglobin: 17.3 g/dL — ABNORMAL HIGH (ref 13.0–17.0)
Immature Granulocytes: 1 %
Lymphocytes Relative: 27 %
Lymphs Abs: 3.6 10*3/uL (ref 0.7–4.0)
MCH: 30.6 pg (ref 26.0–34.0)
MCHC: 34.3 g/dL (ref 30.0–36.0)
MCV: 89.4 fL (ref 80.0–100.0)
Monocytes Absolute: 1.1 10*3/uL — ABNORMAL HIGH (ref 0.1–1.0)
Monocytes Relative: 8 %
Neutro Abs: 8.3 10*3/uL — ABNORMAL HIGH (ref 1.7–7.7)
Neutrophils Relative %: 62 %
Platelets: 278 10*3/uL (ref 150–400)
RBC: 5.65 MIL/uL (ref 4.22–5.81)
RDW: 13.4 % (ref 11.5–15.5)
WBC: 13.5 10*3/uL — ABNORMAL HIGH (ref 4.0–10.5)
nRBC: 0 % (ref 0.0–0.2)

## 2020-07-05 LAB — RESP PANEL BY RT-PCR (FLU A&B, COVID) ARPGX2
Influenza A by PCR: NEGATIVE
Influenza B by PCR: NEGATIVE
SARS Coronavirus 2 by RT PCR: NEGATIVE

## 2020-07-05 MED ORDER — METHYLPREDNISOLONE SODIUM SUCC 1000 MG IJ SOLR
INTRAMUSCULAR | Status: AC
  Filled 2020-07-05: qty 8

## 2020-07-05 MED ORDER — ONDANSETRON HCL 4 MG/2ML IJ SOLN: 4.0000 mg | Freq: Four times a day (QID) | INTRAMUSCULAR | Status: DC | PRN

## 2020-07-05 MED ORDER — VITAMIN B-12 1000 MCG PO TABS
1000.0000 ug | ORAL_TABLET | Freq: Every day | ORAL | Status: DC
  Administered 2020-07-06 – 2020-07-07 (×2): 1000 ug via ORAL
  Filled 2020-07-05 (×2): qty 1

## 2020-07-05 MED ORDER — SODIUM CHLORIDE 0.9 % IV SOLN
1000.0000 mg | INTRAVENOUS | Status: DC
  Filled 2020-07-05: qty 8

## 2020-07-05 MED ORDER — ONDANSETRON HCL 4 MG PO TABS: 4.0000 mg | ORAL_TABLET | Freq: Four times a day (QID) | ORAL | Status: DC | PRN

## 2020-07-05 MED ORDER — VITAMIN D (ERGOCALCIFEROL) 1.25 MG (50000 UNIT) PO CAPS
50000.0000 [IU] | ORAL_CAPSULE | ORAL | Status: DC
  Administered 2020-07-06: 50000 [IU] via ORAL
  Filled 2020-07-05: qty 1

## 2020-07-05 MED ORDER — PANTOPRAZOLE SODIUM 40 MG IV SOLR
40.0000 mg | INTRAVENOUS | Status: DC
  Administered 2020-07-05 – 2020-07-07 (×2): 40 mg via INTRAVENOUS
  Filled 2020-07-05 (×2): qty 40

## 2020-07-05 MED ORDER — SODIUM CHLORIDE 0.9 % IV SOLN
1000.0000 mg | Freq: Once | INTRAVENOUS | Status: AC
  Administered 2020-07-05: 1000 mg via INTRAVENOUS
  Filled 2020-07-05: qty 8

## 2020-07-05 NOTE — ED Provider Notes (Signed)
[facility] EMERGENCY DEPARTMENT Provider Note   CSN: [id number] Arrival date & time: 07/05/20  1337     History Chief Complaint  Patient presents with  . Neck Pain    Hunter Bell is a 30 y.o. male.  HPI      Hunter Bell is a 30 y.o. male, with a history of cervical spinal cord lesion, presenting to the ED with concern for neurologic dysfunction.  March 28: Began to have burning and tingling in the left upper extremity March 30: Progressed to have numbness in the left arm as well as bilateral lower extremity numbness.   March 31: Evaluated in the ED.  Head CT normal.  Given the option to stay in the ED until MRI can be performed, but opted to go home and come back. April 1: [name]. April 2: Patient was able to get to the back and be seen.  MRI performed.  Demyelinating lesion noted on the C4-C5 spinal cord. Patient was admitted to the hospital, evaluated by neurology, and treated with high-dose IV steroids.  He was then discharged on April 4 with prescription for 2 days of prednisone 1250 mg daily.  States his symptoms seemed to improve and that he began to have return of sensation in his extremities, but he has been worsening over the last couple days.  He also endorses numbness extending into the abdomen and chest as well as tingling to the lips bilaterally.  Denies fever/chills, headache, difficulty swallowing, shortness of breath, chest pain, changes in bowel or bladder function, saddle anesthesias, abdominal pain, falls/trauma, vision changes, or any other complaints.    History reviewed. No pertinent past medical history.  Patient Active Problem List   Diagnosis Date Noted  . Transverse myelitis (HCC)   . 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 Vitamin B12 deficiency 06/28/2020  . Acute muscle weakness 06/27/2020    History reviewed. No pertinent surgical history.     Family History  Problem  Relation Age of Onset  . Multiple sclerosis Neg Hx     Social History   Tobacco Use  . Smoking status: Never Smoker  . Smokeless tobacco: Never Used  Vaping Use  . Vaping Use: Never used  Substance Use Topics  . Alcohol use: Yes    Comment: occ. use  . Drug use: Never    Home Medications Prior to Admission medications   Medication Sig Start Date End Date Taking? Authorizing Provider  pantoprazole (PROTONIX) 40 MG tablet Take 1 tablet (40 mg total) by mouth daily. 06/29/20   Remo Lippshen, Joshua Y, MD  predniSONE (DELTASONE) 50 MG tablet Take 25 tablets (1,250 mg total) by mouth daily with lunch for 2 days. 06/29/20 07/09/20  Remo Lippshen, Joshua Y, MD  cyanocobalamin 1000 MCG tablet Take 1 tablet (1,000 mcg total) by mouth daily. 06/30/20   Remo Lippshen, Joshua Y, MD  Vitamin D, Ergocalciferol, (DRISDOL) 1.25 MG (50000 UNIT) CAPS capsule Take 1 capsule (50,000 Units total) by mouth every 7 (seven) days. 06/29/20   Remo Lippshen, Joshua Y, MD    Allergies    Patient has no known allergies.  Review of Systems   Review of Systems  Constitutional: Negative for chills, diaphoresis and fever.  HENT: Negative for trouble swallowing and voice change.   Eyes: Negative for visual disturbance.  Respiratory: Negative for cough and shortness of breath.   Cardiovascular: Negative for chest pain and leg swelling.  Gastrointestinal: Negative for abdominal pain, constipation,  diarrhea, nausea and vomiting.  Genitourinary: Negative for difficulty urinating.  Musculoskeletal: Negative for back pain and neck pain.  Neurological: Positive for weakness and numbness. Negative for dizziness, seizures, syncope, speech difficulty and headaches.  All other systems reviewed and are negative.   Physical Exam Updated Vital Signs BP (!) 126/100 (BP Location: Left Arm)   Pulse 86   Temp 98.2 F (36.8 C) (Oral)   Resp 17   Ht 6\' 1"  (1.854 m)   Wt 108.4 kg   SpO2 97%   BMI 31.53 kg/m   Physical Exam Vitals and nursing note reviewed.   Constitutional:      General: He is not in acute distress.    Appearance: He is well-developed. He is not diaphoretic.  HENT:     Head: Normocephalic and atraumatic.     Mouth/Throat:     Mouth: Mucous membranes are moist.     Pharynx: Oropharynx is clear.  Eyes:     Conjunctiva/sclera: Conjunctivae normal.  Cardiovascular:     Rate and Rhythm: Normal rate and regular rhythm.     Pulses: Normal pulses.          Radial pulses are 2+ on the right side and 2+ on the left side.       Posterior tibial pulses are 2+ on the right side and 2+ on the left side.     Heart sounds: Normal heart sounds.     Comments: Tactile temperature in the extremities appropriate and equal bilaterally. Pulmonary:     Effort: Pulmonary effort is normal. No respiratory distress.     Breath sounds: Normal breath sounds.  Abdominal:     Palpations: Abdomen is soft.     Tenderness: There is no abdominal tenderness. There is no guarding.  Musculoskeletal:     Cervical back: Normal range of motion and neck supple. No tenderness.     Right lower leg: No edema.     Left lower leg: No edema.  Lymphadenopathy:     Cervical: No cervical adenopathy.  Skin:    General: Skin is warm and dry.  Neurological:     Mental Status: He is alert.     Comments: No noted acute cognitive deficit. Grip strength weaker on the left.  Patient has difficulty accurately controlling fine movements of the left hand and especially directing his fingers to make a fist. Strength 4/5 in the left biceps/triceps.  Strength 5/5 in the right upper extremity. Strength 4/5 in the bilateral lower extremities. Endorses decreased sensation to light touch in the left upper extremity as well as the bilateral lower extremities. Cranial nerves III-XII grossly intact.  Handles oral secretions without noted difficulty.  No noted phonation or speech deficit. No facial droop.   Psychiatric:        Mood and Affect: Mood and affect normal.        Speech:  Speech normal.        Behavior: Behavior normal.     ED Results / Procedures / Treatments   Labs (all labs ordered are listed, but only abnormal results are displayed) Labs Reviewed  COMPREHENSIVE METABOLIC PANEL - Abnormal; Notable for the following components:      Result Value   Glucose, Bld 104 (*)    Calcium 8.7 (*)    ALT 53 (*)    All other components within normal limits  CBC WITH DIFFERENTIAL/PLATELET - Abnormal; Notable for the following components:   WBC 13.5 (*)    Hemoglobin 17.3 (*)  Neutro Abs 8.3 (*)    Monocytes Absolute 1.1 (*)    Abs Immature Granulocytes 0.19 (*)    All other components within normal limits  RESP PANEL BY RT-PCR (FLU A&B, COVID) ARPGX2  CSF CULTURE W GRAM STAIN  GRAM STAIN  CULTURE, FUNGUS WITHOUT SMEAR  CSF CELL COUNT WITH DIFFERENTIAL  CSF CELL COUNT WITH DIFFERENTIAL  PROTEIN AND GLUCOSE, CSF  OLIGOCLONAL BANDS, CSF + SERM  DRAW EXTRA CLOT TUBE    EKG None  Radiology No results found.     CT Head Wo Contrast  Result Date: 06/25/2020 CLINICAL DATA:  New onset left-sided numbness EXAM: CT HEAD WITHOUT CONTRAST TECHNIQUE: Contiguous axial images were obtained from the base of the skull through the vertex without intravenous contrast. COMPARISON:  None. FINDINGS: Brain: There is no mass, hemorrhage or extra-axial collection. The size and configuration of the ventricles and extra-axial CSF spaces are normal. The brain parenchyma is normal, without acute or chronic infarction. Vascular: No abnormal hyperdensity of the major intracranial arteries or dural venous sinuses. No intracranial atherosclerosis. Skull: The visualized skull base, calvarium and extracranial soft tissues are normal. Sinuses/Orbits: No fluid levels or advanced mucosal thickening of the visualized paranasal sinuses. No mastoid or middle ear effusion. The orbits are normal. IMPRESSION: Normal head CT. Electronically Signed   By: Deatra Robinson M.D.   On: 06/25/2020 03:13    MR BRAIN W WO CONTRAST  Result Date: 06/27/2020 CLINICAL DATA:  Multiple sclerosis suspected. EXAM: MRI HEAD WITHOUT AND WITH CONTRAST TECHNIQUE: Multiplanar, multiecho pulse sequences of the brain and surrounding structures were obtained without and with intravenous contrast. CONTRAST:  5mL GADAVIST GADOBUTROL 1 MMOL/ML IV SOLN COMPARISON:  None. FINDINGS: Brain: No acute infarction, hemorrhage, hydrocephalus, extra-axial collection or mass lesion. No visible white matter lesions. No abnormal enhancement or restricted diffusion. Vascular: Major arterial flow voids are maintained at the skull base. Skull and upper cervical spine: Normal marrow signal. Please see concurrent MRI of the cervical spine for evaluation of a cord lesion. Sinuses/Orbits: Mild inferior maxillary sinuses and ethmoid air cell mucosal thickening. Other: No mastoid effusions. IMPRESSION: 1. No acute intracranial abnormality. No visible white matter lesions or abnormal enhancement. 2. Please see concurrent MRI of the cervical spine for evaluation of a cord lesion. Electronically Signed   By: Feliberto Harts MD   On: 06/27/2020 16:12   MR CERVICAL SPINE W WO CONTRAST  Result Date: 06/27/2020 CLINICAL DATA:  Suspicion for multiple sclerosis. Check for lesions. EXAM: MRI CERVICAL SPINE WITHOUT AND WITH CONTRAST TECHNIQUE: Multiplanar and multiecho pulse sequences of the cervical spine, to include the craniocervical junction and cervicothoracic junction, were obtained without and with intravenous contrast. CONTRAST:  77mL GADAVIST GADOBUTROL 1 MMOL/ML IV SOLN COMPARISON:  None. FINDINGS: Motion limited evaluation. Alignment: Straightening of the normal cervical lordosis. No substantial sagittal subluxation. Vertebrae: Vertebral body heights are maintained. No specific evidence of acute fracture, discitis/osteomyelitis, or suspicious bone lesion. Cord: At C4-C5 there is a short segment area of T2/stir hyperintensity within the midline  dorsal cord which demonstrates irregular enhancement. This area is mildly expanded. Posterior Fossa, vertebral arteries, paraspinal tissues: Posterior fossa is further characterized on concurrent MRI head. Vertebral artery flow voids are maintained. Disc levels: C2-C3: Mild uncovertebral hypertrophy without evidence of significant canal or foraminal stenosis. C3-C4: Motion limited evaluation no significant canal stenosis. Bilateral uncovertebral hypertrophy with possible mild bilateral foraminal stenosis. C4-C5: Motion limited evaluation. Bilateral uncovertebral hypertrophy without significant canal stenosis. Possible mild left foraminal stenosis. C5-C6:  No significant disc protrusion, foraminal stenosis, or canal stenosis. C6-C7: No significant disc protrusion, foraminal stenosis, or canal stenosis. C7-T1: No significant disc protrusion, foraminal stenosis, or canal stenosis. IMPRESSION: 1. Mildly expansile short-segment T2/STIR hyperintensity within the dorsal cord at C4-C5 with irregular enhancement. Findings are most consistent with active demyelination given the characteristic appearance and the provided clinical history. Infection, transverse myelitis, or tumor are less likely differential considerations. 2. Motion limited evaluation with possibly mild foraminal stenosis bilaterally at C3-C4 and on the left at C4-C5. No significant canal stenosis. Repeat study (possibly with sedation) could better characterize the foramina if clinically indicated. Electronically Signed   By: Feliberto Harts MD   On: 06/27/2020 15:50     Procedures .Critical Care Performed by: Anselm Pancoast, PA-C Authorized by: Anselm Pancoast, PA-C   Critical care provider statement:    Critical care time (minutes):  35   Critical care time was exclusive of:  Separately billable procedures and treating other patients   Critical care was necessary to treat or prevent imminent or life-threatening deterioration of the following  conditions:  CNS failure or compromise   Critical care was time spent personally by me on the following activities:  Ordering and performing treatments and interventions, ordering and review of laboratory studies, ordering and review of radiographic studies, re-evaluation of patient's condition, pulse oximetry, review of old charts, development of treatment plan with patient or surrogate, discussions with consultants, examination of patient and obtaining history from patient or surrogate   I assumed direction of critical care for this patient from another provider in my specialty: no     Care discussed with: admitting provider       Medications Ordered in ED Medications  methylPREDNISolone sodium succinate (SOLU-MEDROL) 1,000 mg in sodium chloride 0.9 % 50 mL IVPB (has no administration in time range)    ED Course  I have reviewed the triage vital signs and the nursing notes.  Pertinent labs & imaging results that were available during my care of the patient were reviewed by me and considered in my medical decision making (see chart for details).  Clinical Course as of 07/05/20 1752  Sun Jul 05, 2020  1541 Spoke with Dr. Otelia Limes, neurologist.  We discussed the patient's symptoms, timeline, and results of previous imaging. He thinks perhaps patient did not have enough time receiving IV steroids. Recommends admission to [facility] via the hospitalist.  Administer 1000 mg IV methylprednisolone.   Patient will need MR with/without contrast of the brain, cervical spine, and thoracic spine. [SJ]  1549 Dr. Otelia Limes called back. States patient needs to have LP done since he is progressing. Ideally, LP needs to be done here prior to transfer. [SJ]  1700 Patient consented regarding risks and benefits of LP, alternatives including waiting, and agreed to attempt once in the ED.  I attempted LP unsuccessful at bedside.  Unable to introduce needle into spinal sac space.  Patient tolerated this procedure  well with minimal bleeding or discomfort. [MT]  1715 I discussed Lp with Flu[facility] radiologist and Dr Otelia Limes, neurologist.  [facility] team with staffing and logistical challenges at this time, but can perform LP in the morning - okay to defer until morning per Dr Otelia Limes.  I agree this would be the safest method of obtaining an LP to avoid repeated trauma or injury from blind stick. [MT]  1737 Spoke with Dr. Adrian Blackwater, hospitalist.  Agrees to admit the patient. [SJ]    Clinical Course User Index [MT]  Terald Sleeper, MD [SJ] Leetta Hendriks, Hillard Danker, PA-C   MDM Rules/Calculators/A&P                          Patient presents with progressive decreased sensation as well as weakness in the extremities. Patient is nontoxic appearing, afebrile, not tachycardic, not tachypneic, not hypotensive, maintains excellent SPO2 on room air, and is in no apparent distress.   I have reviewed the patient's chart to obtain more information.   I reviewed and interpreted the patient's labs and previous radiological studies. Patient to be admitted at [facility] and will undergo MRI once there.  He will also undergo LP via IR in the morning.  Findings and plan of care discussed with attending physician, Alvester Chou, MD. Dr. Renaye Rakers personally evaluated and examined this patient.  Final Clinical Impression(s) / ED Diagnoses Final diagnoses:  Left arm weakness  Bilateral leg numbness    Rx / DC Orders ED Discharge Orders    None       Concepcion Living 07/05/20 1755    Terald Sleeper, MD 07/06/20 [phone]

## 2020-07-05 NOTE — ED Triage Notes (Signed)
Emergency Medicine Provider Triage Evaluation Note  Hunter Bell , a 30 y.o. male  was evaluated in triage.  Pt complains of worsening symptoms of numbness and weakness in the extremities.  Also endorses numbness and tingling now in the chest towards the neck.  Review of Systems  Positive: Extremity numbness and weakness.  Numbness in the chest Negative: Difficulty breathing, difficulty swallowing, vision changes, chest pain, changes in bowel or bladder function, saddle anesthesias.  Physical Exam  BP (!) 126/100 (BP Location: Left Arm)   Pulse 86   Temp 98.2 F (36.8 C) (Oral)   Resp 17   Ht 6\' 1"  (1.854 m)   Wt 108.4 kg   SpO2 97%   BMI 31.53 kg/m  Gen:   Awake, no distress   HEENT:  Atraumatic  Resp:  Normal effort  Cardiac:  Normal rate  Abd:   Nondistended, nontender  MSK:   Able to move all 4 extremities (see below) Neuro:  Speech clear, tolerates oral secretions.  Difficulty controlling movements of the left hand, weakness with grip strength on the left.  Difficulty coordinating left upper extremity in general.  Endorses decreased sensation to the bilateral lower extremities.  Medical Decision Making  Medically screening exam initiated at 2:35 PM.  Appropriate orders placed.  Hunter Bell was informed that the remainder of the evaluation will be completed by another provider, this initial triage assessment does not replace that evaluation, and the importance of remaining in the ED until their evaluation is complete.  Clinical Impression   Patient had previous cervical spinal lesion noted on MRI April 2.  Symptoms seemed to start to improve with steroid therapy, but now seem to be worsening.      07-11-2004, PA-C 07/05/20 1706

## 2020-07-05 NOTE — ED Notes (Signed)
Called [facility] at [phone] to give report, nurse continues to be in isolation room.

## 2020-07-05 NOTE — ED Notes (Signed)
Grips equal, smile equal, eom's intact, tongue midline.

## 2020-07-05 NOTE — ED Triage Notes (Signed)
Pt c/o neck pain and has been treated with steroids.   Pt states he has regressed since finishing the steroids.  Pt is unable to return to work without a doctor note.

## 2020-07-05 NOTE — ED Notes (Signed)
This nurse called [phone] to give report, nurse is in isolation room and will return call when available.

## 2020-07-05 NOTE — H&P (Signed)
History and Physical  Hunter Muskratdam Croslin ZOX:[id number] DOB: Mar 27, 1991 DOA: 07/05/2020  Referring physician: Harolyn RutherfordShawn Joy, PA-C, EDP PCP: Patient, No Pcp Per (Inactive)  Outpatient Specialists:   Patient Coming From: home  Chief Complaint: weakness left arm  HPI: Hunter Bell is a 30 y.o. 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  Patient was [facility], started on high-dose steroids.  Patient had some improvement of his symptoms, sent home on high-dose prednisone for 2 days.  After stopping the medications, his symptoms slowly returned.  Currently having difficulty coordinating movements in his left hand with paresthesias and weakness.  Is also having paresthesias in his left foot and lower leg.  He perceives increasing weakness.  No other palliating or provoking factors.  Emergency Department Course: Neurology consulted.  Recommended redosing with high-dose steroids [facility].  Review of Systems:   Pt denies any fevers, chills, nausea, vomiting, diarrhea, constipation, abdominal pain, shortness of breath, dyspnea on exertion, orthopnea, cough, wheezing, palpitations, headache, vision changes, lightheadedness, dizziness, melena, rectal bleeding.  Review of systems are otherwise negative  History reviewed. No pertinent past medical history. History reviewed. No pertinent surgical history. Social History:  reports that he has never smoked. He has never used smokeless tobacco. He reports current alcohol use. He reports that he does not use drugs. Patient lives at home  No Known Allergies  Family History  Problem Relation Age of Onset  . Multiple sclerosis Neg Hx       Prior to Admission medications   Medication Sig Start Date End Date Taking? Authorizing Provider  cyanocobalamin 1000 MCG tablet Take 1 tablet (1,000 mcg total) by mouth daily. 06/30/20  Yes Remo Lippshen, Joshua Y, MD  pantoprazole  (PROTONIX) 40 MG tablet Take 1 tablet (40 mg total) by mouth daily. 06/29/20  Yes Remo Lippshen, Joshua Y, MD  Vitamin D, Ergocalciferol, (DRISDOL) 1.25 MG (50000 UNIT) CAPS capsule Take 1 capsule (50,000 Units total) by mouth every 7 (seven) days. 06/29/20  Yes Remo Lippshen, Joshua Y, MD  predniSONE (DELTASONE) 50 MG tablet Take 25 tablets (1,250 mg total) by mouth daily with lunch for 2 days. 06/29/20 07/09/20  Remo Lippshen, Joshua Y, MD    Physical Exam: BP (!) 126/100 (BP Location: Left Arm)   Pulse 86   Temp 98.2 F (36.8 C) (Oral)   Resp 17   Ht 6\' 1"  (1.854 m)   Wt 108.4 kg   SpO2 97%   BMI 31.53 kg/m   . General: Young male. Awake and alert and oriented x3. No acute cardiopulmonary distress.  Marland Kitchen. HEENT: Normocephalic atraumatic.  Right and left ears normal in appearance.  Pupils equal, round, reactive to light. Extraocular muscles are intact. Sclerae anicteric and noninjected.  Moist mucosal membranes. No mucosal lesions.  . Neck: Neck supple without lymphadenopathy. No carotid bruits. No masses palpated.  . Cardiovascular: Regular rate with normal S1-S2 sounds. No murmurs, rubs, gallops auscultated. No JVD.  Marland Kitchen. Respiratory: Good respiratory effort with no wheezes, rales, rhonchi. Lungs clear to auscultation bilaterally.  No accessory muscle use. . Abdomen: Soft, nontender, nondistended. Active bowel sounds. No masses or hepatosplenomegaly  . Skin: No rashes, lesions, or ulcerations.  Dry, warm to touch. 2+ dorsalis pedis and radial pulses. . Musculoskeletal: No calf or leg pain. All major joints not erythematous nontender.  No upper or lower joint deformation.  Good ROM.  No contractures  . Psychiatric: Intact judgment and insight. Pleasant  and cooperative. . Neurologic: Diminished coordination with left hand and arm.  Weakness left lower extremity with diminished sensation in both left lower extremity as well as left upper extremity.  Cranial nerves II through XII are grossly intact.           Labs on  Admission: I have personally reviewed following labs and imaging studies  CBC: Recent Labs  Lab 07/05/20 1458  WBC 13.5*  NEUTROABS 8.3*  HGB 17.3*  HCT 50.5  MCV 89.4  PLT 278   Basic Metabolic Panel: Recent Labs  Lab 06/29/20 0744 07/05/20 1458  NA 135 137  K 4.0 3.5  CL 103 100  CO2 22 26  GLUCOSE 189* 104*  BUN 16 17  CREATININE 1.19 1.20  CALCIUM 9.2 8.7*   GFR: Estimated Creatinine Clearance: 116.2 mL/min (by C-G formula based on SCr of 1.2 mg/dL). Liver Function Tests: Recent Labs  Lab 06/29/20 0744 07/05/20 1458  AST 24 22  ALT 53* 53*  ALKPHOS 68 63  BILITOT 0.6 0.7  PROT 7.4 7.1  ALBUMIN 3.9 3.9   No results for input(s): LIPASE, AMYLASE in the last 168 hours. No results for input(s): AMMONIA in the last 168 hours. Coagulation Profile: No results for input(s): INR, PROTIME in the last 168 hours. Cardiac Enzymes: No results for input(s): CKTOTAL, CKMB, CKMBINDEX, TROPONINI in the last 168 hours. BNP (last 3 results) No results for input(s): PROBNP in the last 8760 hours. HbA1C: No results for input(s): HGBA1C in the last 72 hours. CBG: No results for input(s): GLUCAP in the last 168 hours. Lipid Profile: No results for input(s): CHOL, HDL, LDLCALC, TRIG, CHOLHDL, LDLDIRECT in the last 72 hours. Thyroid Function Tests: No results for input(s): TSH, T4TOTAL, FREET4, T3FREE, THYROIDAB in the last 72 hours. Anemia Panel: No results for input(s): VITAMINB12, FOLATE, FERRITIN, TIBC, IRON, RETICCTPCT in the last 72 hours. Urine analysis: No results found for: COLORURINE, APPEARANCEUR, LABSPEC, PHURINE, GLUCOSEU, HGBUR, BILIRUBINUR, KETONESUR, PROTEINUR, UROBILINOGEN, NITRITE, LEUKOCYTESUR Sepsis Labs: @LABRCNTIP (procalcitonin:4,lacticidven:4) ) Recent Results (from the past 240 hour(s))  Resp Panel by RT-PCR (Flu A&B, Covid) Nasopharyngeal Swab     Status: None   Collection Time: 06/27/20  4:59 PM   Specimen: Nasopharyngeal Swab; Nasopharyngeal(NP)  swabs in vial transport medium  Result Value Ref Range Status   SARS Coronavirus 2 by RT PCR NEGATIVE NEGATIVE Final    Comment: (NOTE) SARS-CoV-2 target nucleic acids are NOT DETECTED.  The SARS-CoV-2 RNA is generally detectable in upper respiratory specimens during the acute phase of infection. The lowest concentration of SARS-CoV-2 viral copies this assay can detect is 138 copies/mL. A negative result does not preclude SARS-Cov-2 infection and should not be used as the sole basis for treatment or other patient management decisions. A negative result may occur with  improper specimen collection/handling, submission of specimen other than nasopharyngeal swab, presence of viral mutation(s) within the areas targeted by this assay, and inadequate number of viral copies(<138 copies/mL). A negative result must be combined with clinical observations, patient history, and epidemiological information. The expected result is Negative.  Fact Sheet for Patients:  08/27/20  Fact Sheet for Healthcare Providers:  BloggerCourse.com  This test is no t yet approved or cleared by the [place] FDA and  has been authorized for detection and/or diagnosis of SARS-CoV-2 by FDA under an Emergency Use Authorization (EUA). This EUA will remain  in effect (meaning this test can be used) for the duration of the COVID-19 declaration under Section 564(b)(1) of the  Act, 21 U.S.C.section 360bbb-3(b)(1), unless the authorization is terminated  or revoked sooner.       Influenza A by PCR NEGATIVE NEGATIVE Final   Influenza B by PCR NEGATIVE NEGATIVE Final    Comment: (NOTE) The Xpert Xpress SARS-CoV-2/FLU/RSV plus assay is intended as an aid in the diagnosis of influenza from Nasopharyngeal swab specimens and should not be used as a sole basis for treatment. Nasal washings and aspirates are unacceptable for Xpert Xpress  SARS-CoV-2/FLU/RSV testing.  Fact Sheet for Patients: BloggerCourse.com  Fact Sheet for Healthcare Providers: SeriousBroker.it  This test is not yet approved or cleared by the [place] FDA and has been authorized for detection and/or diagnosis of SARS-CoV-2 by FDA under an Emergency Use Authorization (EUA). This EUA will remain in effect (meaning this test can be used) for the duration of the COVID-19 declaration under Section 564(b)(1) of the Act, 21 U.S.C. section 360bbb-3(b)(1), unless the authorization is terminated or revoked.  Performed at [facility], [address], [place], [place] [address]   Resp Panel by RT-PCR (Flu A&B, Covid) Nasopharyngeal Swab     Status: None   Collection Time: 07/05/20  3:57 PM   Specimen: Nasopharyngeal Swab; Nasopharyngeal(NP) swabs in vial transport medium  Result Value Ref Range Status   SARS Coronavirus 2 by RT PCR NEGATIVE NEGATIVE Final    Comment: (NOTE) SARS-CoV-2 target nucleic acids are NOT DETECTED.  The SARS-CoV-2 RNA is generally detectable in upper respiratory specimens during the acute phase of infection. The lowest concentration of SARS-CoV-2 viral copies this assay can detect is 138 copies/mL. A negative result does not preclude SARS-Cov-2 infection and should not be used as the sole basis for treatment or other patient management decisions. A negative result may occur with  improper specimen collection/handling, submission of specimen other than nasopharyngeal swab, presence of viral mutation(s) within the areas targeted by this assay, and inadequate number of viral copies(<138 copies/mL). A negative result must be combined with clinical observations, patient history, and epidemiological information. The expected result is Negative.  Fact Sheet for Patients:  BloggerCourse.com  Fact Sheet for Healthcare Providers:   [employer]ious[employer]ker.it  This test is no t yet approved or cleared by the [place] FDA and  has been authorized for detection and/or diagnosis of SARS-CoV-2 by FDA under an Emergency Use Authorization (EUA). This EUA will remain  in effect (meaning this test can be used) for the duration of the COVID-19 declaration under Section 564(b)(1) of the Act, 21 U.S.C.section 360bbb-3(b)(1), unless the authorization is terminated  or revoked sooner.       Influenza A by PCR NEGATIVE NEGATIVE Final   Influenza B by PCR NEGATIVE NEGATIVE Final    Comment: (NOTE) The Xpert Xpress SARS-CoV-2/FLU/RSV plus assay is intended as an aid in the diagnosis of influenza from Nasopharyngeal swab specimens and should not be used as a sole basis for treatment. Nasal washings and aspirates are unacceptable for Xpert Xpress SARS-CoV-2/FLU/RSV testing.  Fact Sheet for Patients: BloggerCourse.com  Fact Sheet for Healthcare Providers: [employer]ious[employer]ker.it  This test is not yet approved or cleared by the [place] FDA and has been authorized for detection and/or diagnosis of SARS-CoV-2 by FDA under an Emergency Use Authorization (EUA). This EUA will remain in effect (meaning this test can be used) for the duration of the COVID-19 declaration under Section 564(b)(1) of the Act, 21 U.S.C. section 360bbb-3(b)(1), unless the authorization is terminated or revoked.  Performed at [facility], [address], [place],  [place] [address]      Radiological Exams on Admission: No results found.   Assessment/Plan: Active Problems:   Demyelinating disease with symptom of myelopathy [facility])    This patient was discussed with the ED physician, including pertinent vitals, physical exam findings, labs, and imaging.  We also discussed care given by the ED provider.  1. Demyelinating disease with symptoms of myelopathy a. Admit to [facility] b. Neurology consult c. MRI of head, cervical spine, thoracic spine with and without contrast d. Continue steroids e. Lumbar puncture by IR tomorrow under fluoroscopy f. Patient n.p.o. after midnight   DVT prophylaxis: SCDs Consultants: Neurology Code Status: Full code Family Communication: None Disposition Plan: Patient should be able to return home following admission   Levie Heritage, DO

## 2020-07-05 NOTE — ED Notes (Signed)
Pt given crackers and gingerale.

## 2020-07-06 ENCOUNTER — Encounter (HOSPITAL_COMMUNITY): Payer: Self-pay | Admitting: Family Medicine

## 2020-07-06 ENCOUNTER — Encounter: Payer: Self-pay | Admitting: Student

## 2020-07-06 ENCOUNTER — Inpatient Hospital Stay (HOSPITAL_COMMUNITY): Payer: Self-pay

## 2020-07-06 DIAGNOSIS — G379 Demyelinating disease of central nervous system, unspecified: Secondary | ICD-10-CM

## 2020-07-06 DIAGNOSIS — R29898 Other symptoms and signs involving the musculoskeletal system: Secondary | ICD-10-CM

## 2020-07-06 DIAGNOSIS — R2990 Unspecified symptoms and signs involving the nervous system: Secondary | ICD-10-CM

## 2020-07-06 DIAGNOSIS — G373 Acute transverse myelitis in demyelinating disease of central nervous system: Principal | ICD-10-CM

## 2020-07-06 LAB — CSF CELL COUNT WITH DIFFERENTIAL
RBC Count, CSF: 1 /mm3 — ABNORMAL HIGH
Tube #: 1
WBC, CSF: 3 /mm3 (ref 0–5)

## 2020-07-06 LAB — PROTEIN AND GLUCOSE, CSF
Glucose, CSF: 78 mg/dL — ABNORMAL HIGH (ref 40–70)
Total  Protein, CSF: 56 mg/dL — ABNORMAL HIGH (ref 15–45)

## 2020-07-06 IMAGING — MR MR HEAD WO/W CM
16 of 19 series · 34 of 48 positions shown · IV contrast (gadavist)
Comparison: [DATE]

CLINICAL DATA: Demyelinating disease

EXAM:
MRI HEAD WITHOUT AND WITH CONTRAST
MRI CERVICAL SPINE WITHOUT AND WITH CONTRAST
MRI THORACIC SPINE WITHOUT AND WITH CONTRAST
TECHNIQUE: Multiplanar, multiecho pulse sequences of the brain and surrounding
structures, cervical spine and thoracic spine were obtained without
and with intravenous contrast.
CONTRAST:  10mL GADAVIST GADOBUTROL 1 MMOL/ML IV SOLN

[Series 5: DWI · axial · 3.0mm · 0.88mm/px · z∈[-65,+81]mm · 5 of 100 slices shown (1 of 4)]
[im 1/100]
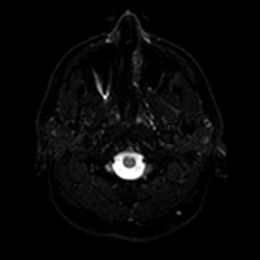
[im 25/100]
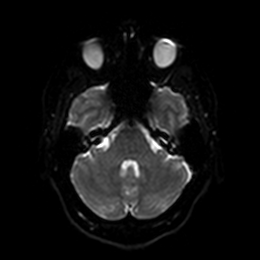
[im 50/100]
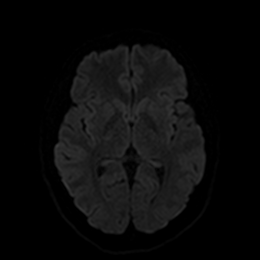
[im 75/100]
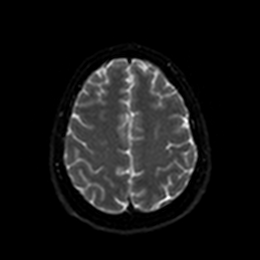
[im 100/100]
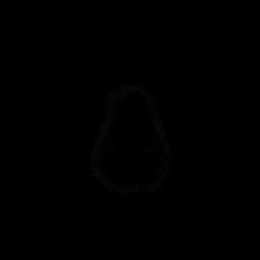

[Series 6: DWI · axial · 3.0mm · 0.88mm/px · z∈[-65,+81]mm · 2 of 48 slices shown (2 of 4)]
[im 1/48]
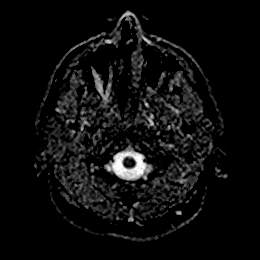
[im 48/48]
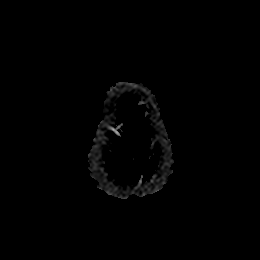

[Series 7: DWI · coronal · 4.0mm · 0.88mm/px · 3 of 70 slices shown (3 of 4)]
[im 1/70]
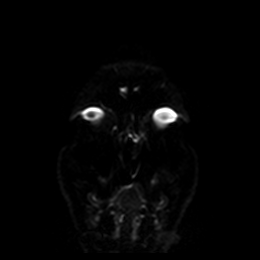
[im 35/70]
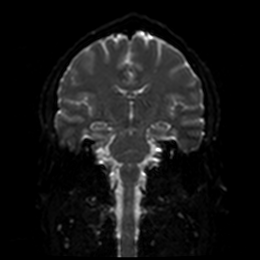
[im 70/70]
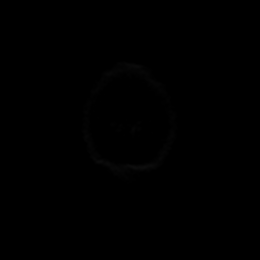

[Series 8: DWI · coronal · 4.0mm · 0.88mm/px · 1 of 35 slices shown (4 of 4)]
[im 1/35]
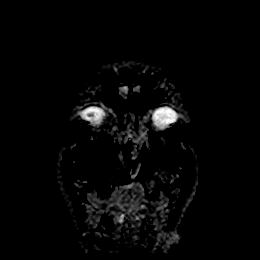

[Series 9: T1 · sagittal · 5.0mm · 0.75mm/px · 1 of 25 slices shown]
[im 1/25]
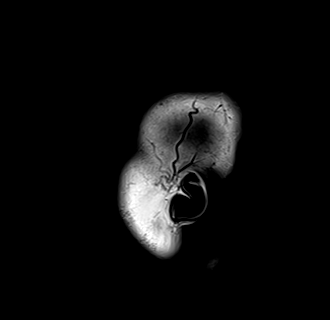

[Series 10: T2 · axial · 5.0mm · 0.72mm/px · 1 of 27 slices shown (1 of 2)]
[im 1/27]
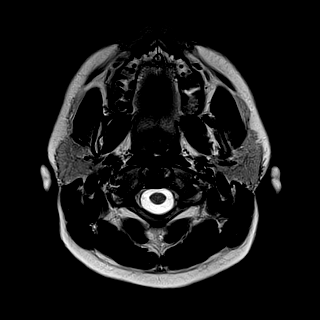

[Series 11: FLAIR · axial · 5.0mm · 0.45mm/px · 1 of 27 slices shown]
[im 1/27]
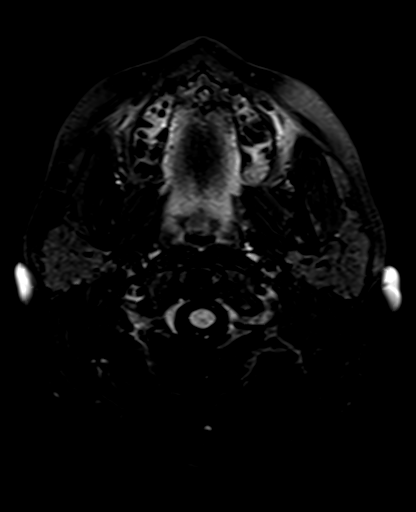

[Series 12: mag_images · axial · 3.0mm · 0.90mm/px · z∈[-88,+89]mm · 2 of 60 slices shown]
[im 1/60]
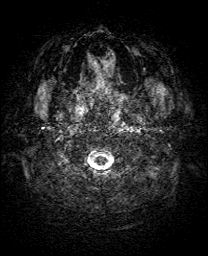
[im 60/60]
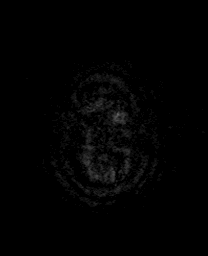

[Series 13: pha_images · axial · 3.0mm · 0.90mm/px · z∈[-88,+89]mm · 2 of 60 slices shown]
[im 1/60]
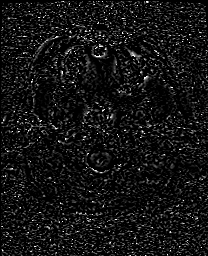
[im 60/60]
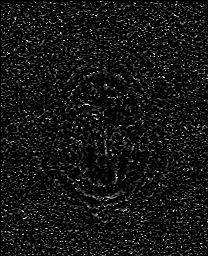

[Series 14: swi_images · axial · 3.0mm · 0.90mm/px · z∈[-88,+89]mm · 2 of 60 slices shown]
[im 1/60]
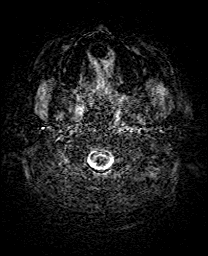
[im 60/60]
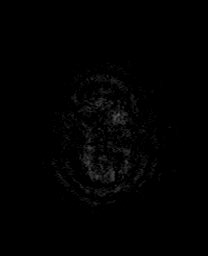

[Series 15: mip_images(sw) · axial · 24.0mm · 0.90mm/px · z∈[-78,+78]mm · 2 of 53 slices shown]
[im 1/53]
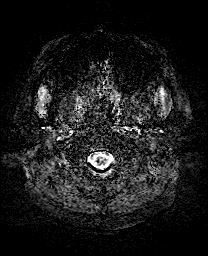
[im 53/53]
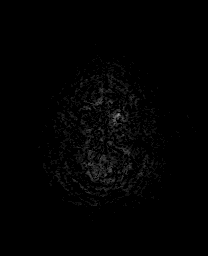

[Series 17: T2 · coronal · 5.0mm · 0.34mm/px · 1 of 30 slices shown (2 of 2)]
[im 1/30]
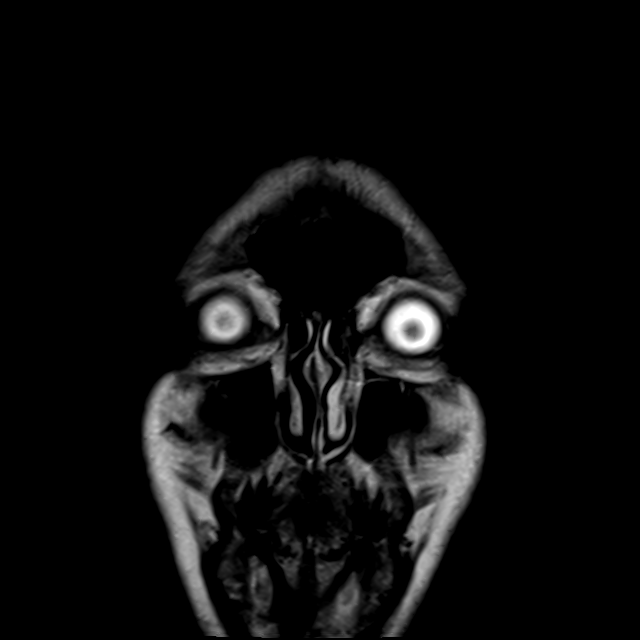

[Series 18: t2_space_dark-fluid_sag_p2_ns-ir · sagittal · 1.0mm · 0.49mm/px · 6 of 160 slices shown]
[im 1/160]
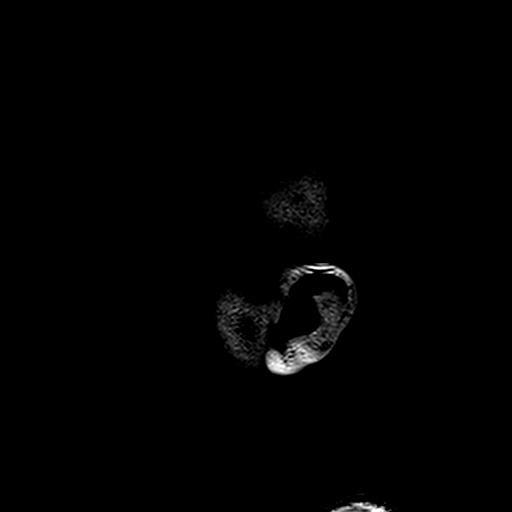
[im 32/160]
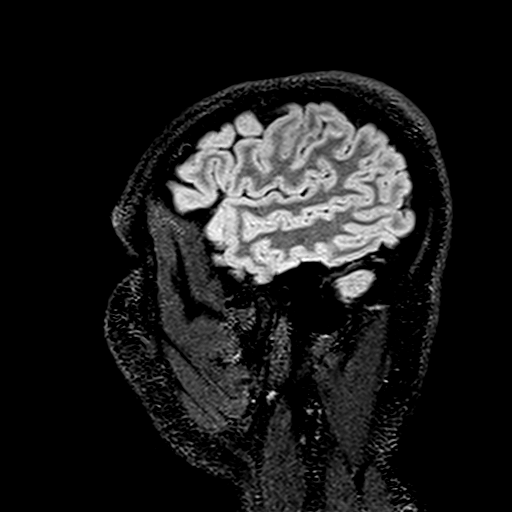
[im 64/160]
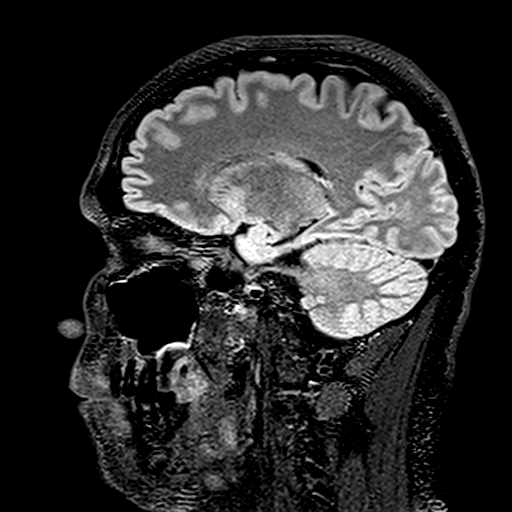
[im 96/160]
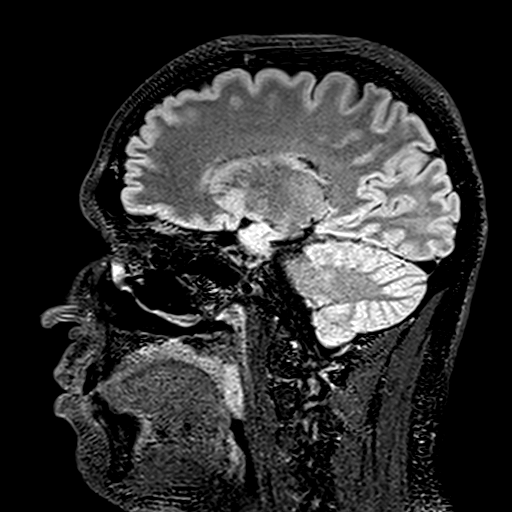
[im 128/160]
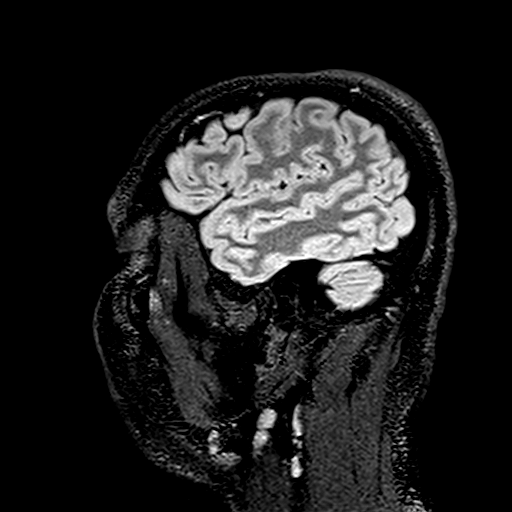
[im 160/160]
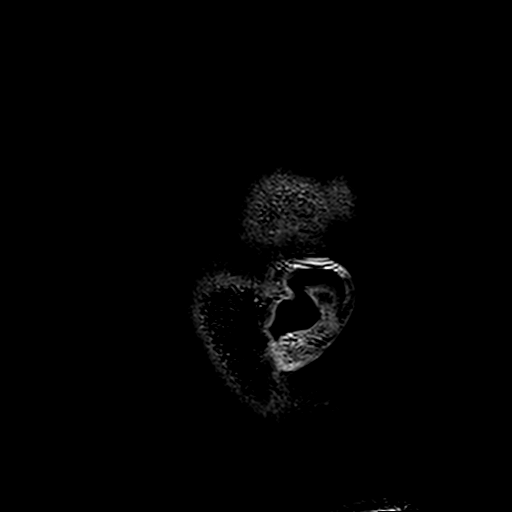

[Series 19: t2_space_dark-fluid_sag_p2_ns-ir_mpr_ axial · axial · 1.0mm · 0.45mm/px · z∈[-77,-10]mm · 3 of 169 slices shown]
[im 1/169]
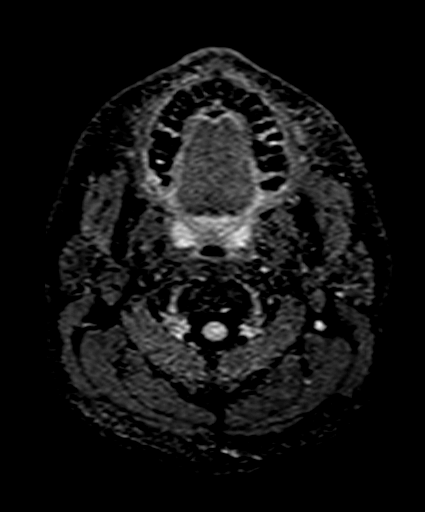
[im 34/169]
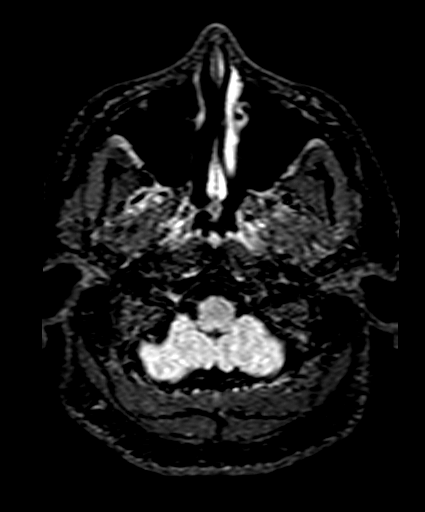
[im 68/169]
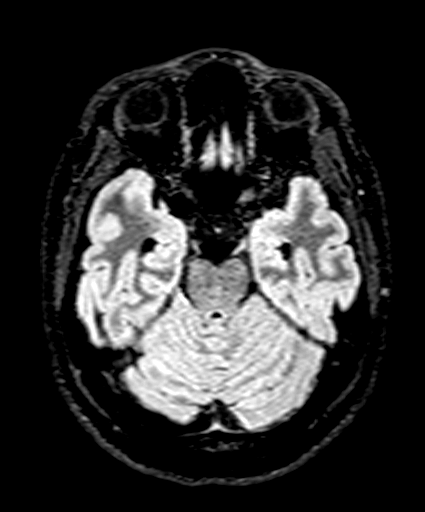

[Series 21: T1 post-contrast · coronal · 5.0mm · 0.34mm/px · 1 of 30 slices shown (1 of 2)]
[im 1/30]
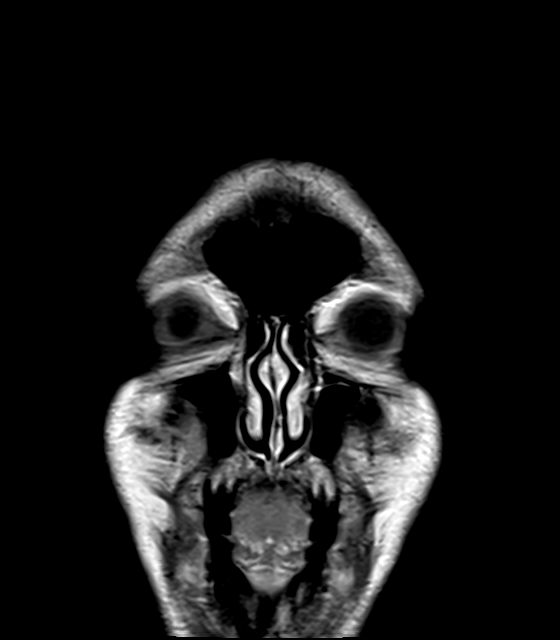

[Series 23: T1 post-contrast · sagittal · 5.0mm · 0.75mm/px · 1 of 25 slices shown (2 of 2)]
[im 1/25]
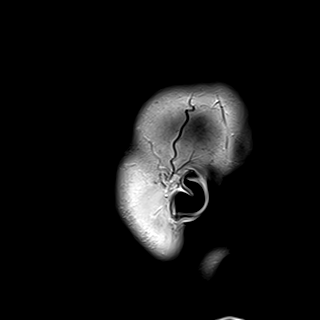

[34 of 48 positions shown; findings below may reference images not displayed]

FINDINGS: MRI HEAD FINDINGS

Brain: No acute infarct, mass effect or extra-axial collection. No
acute or chronic hemorrhage. Normal white matter signal, parenchymal
volume and CSF spaces. The midline structures are normal.

Vascular: Major flow voids are preserved.

Skull and upper cervical spine: Normal calvarium and skull base.
Visualized upper cervical spine and soft tissues are normal.

Sinuses/Orbits:No paranasal sinus fluid levels or advanced mucosal
thickening. No mastoid or middle ear effusion. Normal orbits.

MRI CERVICAL SPINE FINDINGS

Alignment: Normal

Vertebrae: Normal

Cord: Unchanged size of hyperintense T2-weighted signal lesion at
the C4-5 level. There is more distinct contrast enhancement on the
current study, which may partially be due to technical factors.

Posterior Fossa, vertebral arteries, paraspinal tissues: Negative

Disc levels: There is no disc herniation, spinal canal stenosis or
neural impingement.

MRI THORACIC SPINE FINDINGS

Alignment: Physiologic.

Vertebrae: No fracture, evidence of discitis, or bone lesion.

Cord: Normal signal and morphology.

Paraspinal soft tissues: Negative.

Disc levels: There is a small central disc protrusion at T7-8
without spinal canal stenosis.
IMPRESSION: 1. Normal MRI of the brain.
2. Unchanged size of hyperintense T2-weighted signal lesion at the
C4-5 level with more distinct contrast enhancement, again favored to
represent demyelinating disease.

## 2020-07-06 IMAGING — RF DG SPINAL PUNCT LUMBAR DIAG WITH FL CT GUIDANCE
1 series · 1 of 1 positions shown · non-contrast
Comparison: None.

CLINICAL DATA: Demyelinating disease.

EXAM:
DIAGNOSTIC LUMBAR PUNCTURE UNDER FLUOROSCOPIC GUIDANCE

[Series 1: cp_standard · 0.17mm/px · 1 of 1 slices shown]
[im 1/1]
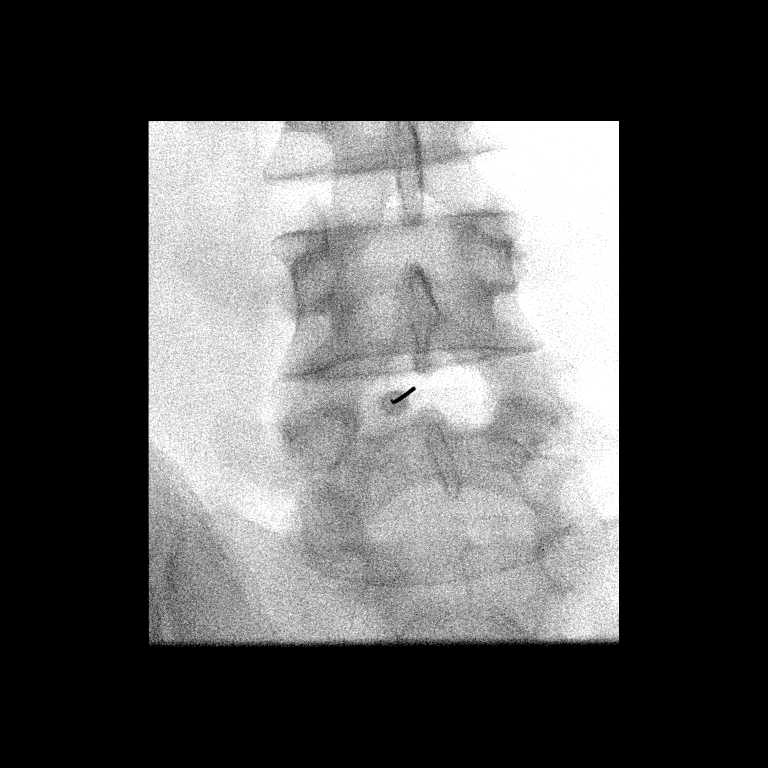

[1 of 1 positions shown; findings below may reference images not displayed]

FLUOROSCOPY TIME:  Fluoroscopy Time:  0 minutes 24 seconds

Radiation Exposure Index (if provided by the fluoroscopic device):

Number of Acquired Spot Images: 0

PROCEDURE:
Informed consent was obtained from the patient prior to the
procedure, including potential complications of headache, allergy,
and pain. With the patient prone, the lower back was prepped with
Betadine. 1% Lidocaine was used for local anesthesia. Lumbar
puncture was performed at the L4-5 level using a 20 gauge needle
with return of clear CSF with an opening pressure of 12 cm water. 9
ml of CSF were obtained for laboratory studies. The patient
tolerated the procedure well and there were no apparent
complications.
IMPRESSION: Successful lumbar puncture under fluoroscopy.

## 2020-07-06 IMAGING — MR MR CERVICAL SPINE WO/W CM
6 of 8 series · 30 of 48 positions shown · IV contrast (gadavist)
Comparison: [DATE]

CLINICAL DATA: Demyelinating disease

EXAM:
MRI HEAD WITHOUT AND WITH CONTRAST
MRI CERVICAL SPINE WITHOUT AND WITH CONTRAST
MRI THORACIC SPINE WITHOUT AND WITH CONTRAST
TECHNIQUE: Multiplanar, multiecho pulse sequences of the brain and surrounding
structures, cervical spine and thoracic spine were obtained without
and with intravenous contrast.
CONTRAST:  10mL GADAVIST GADOBUTROL 1 MMOL/ML IV SOLN

[Series 9: T2 · sagittal · 3.0mm · 0.69mm/px · 3 of 15 slices shown (1 of 2)]
[im 1/15]
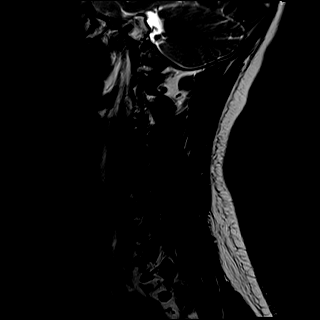
[im 8/15]
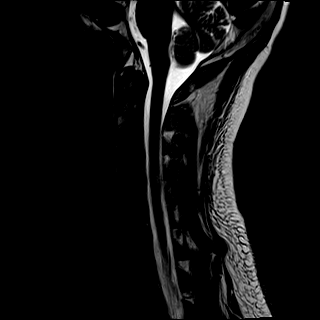
[im 15/15]
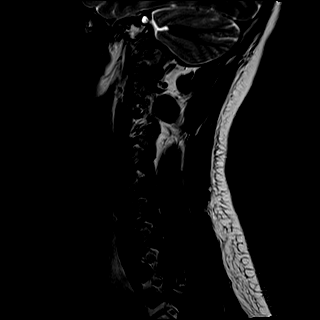

[Series 10: T1 · sagittal · 3.0mm · 0.69mm/px · 3 of 15 slices shown (1 of 2)]
[im 1/15]
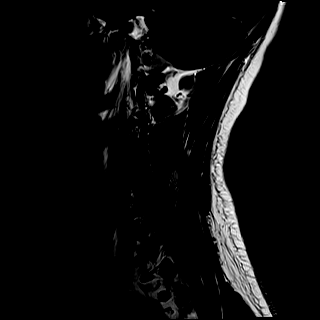
[im 8/15]
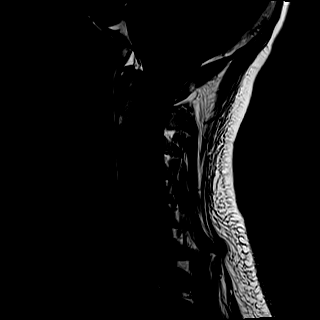
[im 15/15]
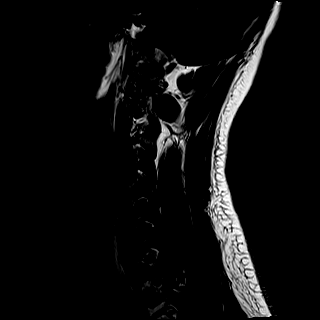

[Series 11: STIR · sagittal · 3.0mm · 0.86mm/px · 3 of 15 slices shown]
[im 1/15]
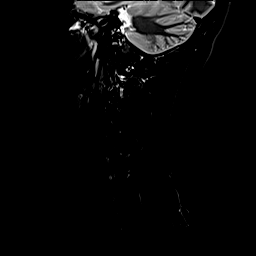
[im 8/15]
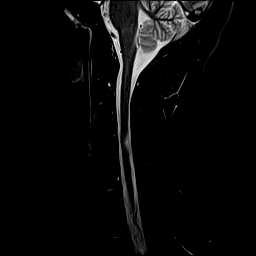
[im 15/15]
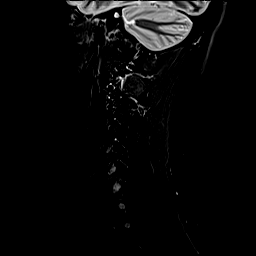

[Series 12: T2 · axial · 3.0mm · 0.66mm/px · z∈[-239,-115]mm · 9 of 40 slices shown (2 of 2)]
[im 1/40]
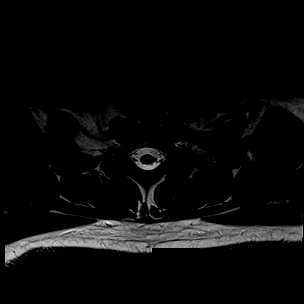
[im 5/40]
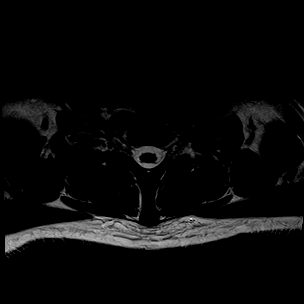
[im 10/40]
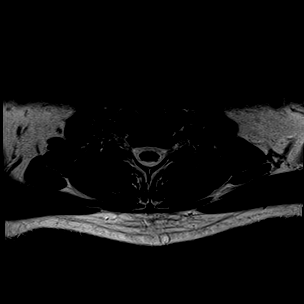
[im 15/40]
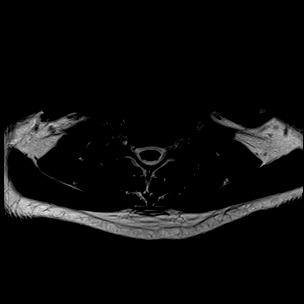
[im 20/40]
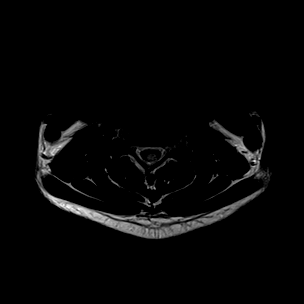
[im 25/40]
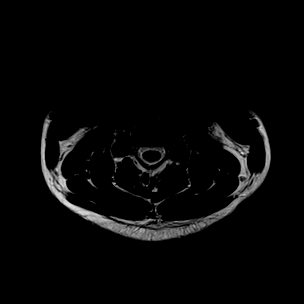
[im 30/40]
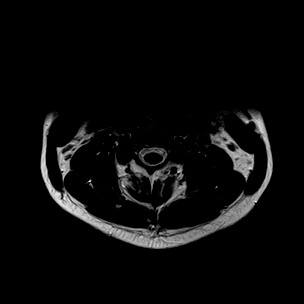
[im 35/40]
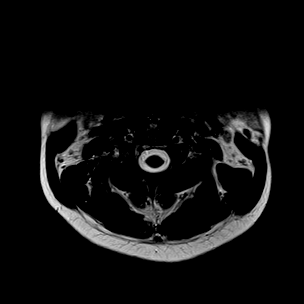
[im 40/40]
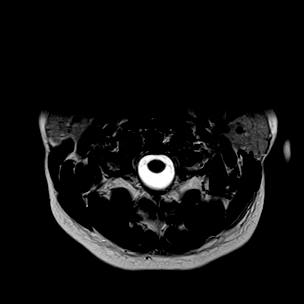

[Series 14: T1 · axial · 3.0mm · 0.39mm/px · z∈[-239,-115]mm · 9 of 40 slices shown (2 of 2)]
[im 1/40]
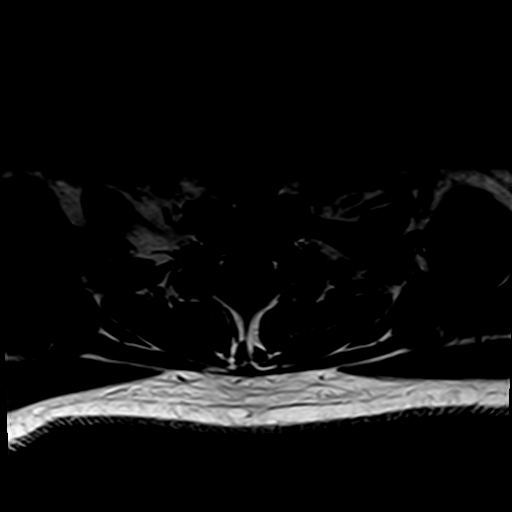
[im 5/40]
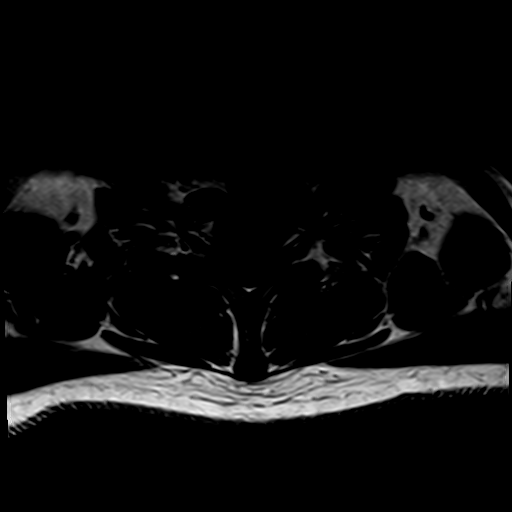
[im 10/40]
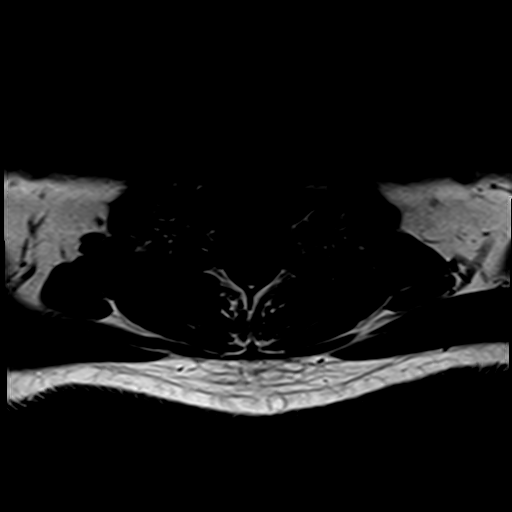
[im 15/40]
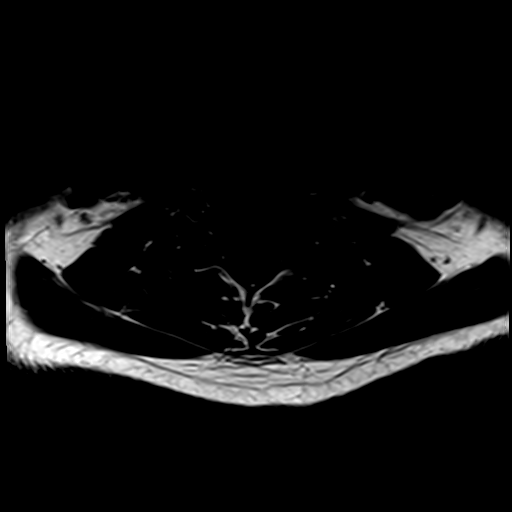
[im 20/40]
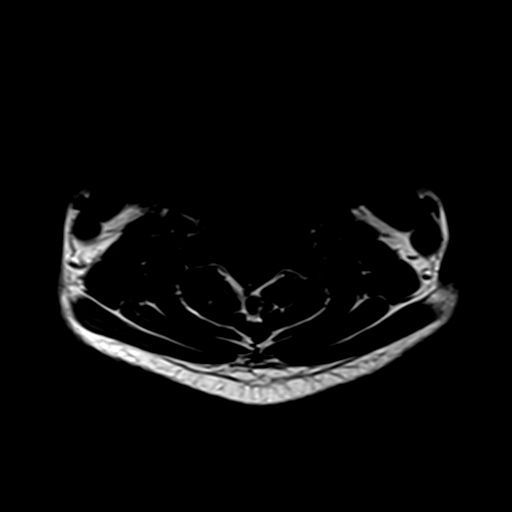
[im 25/40]
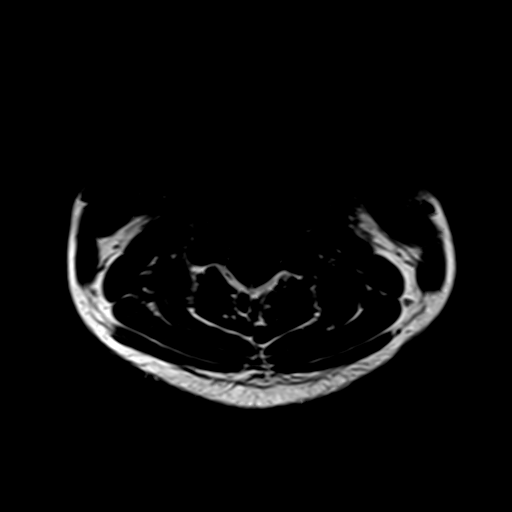
[im 30/40]
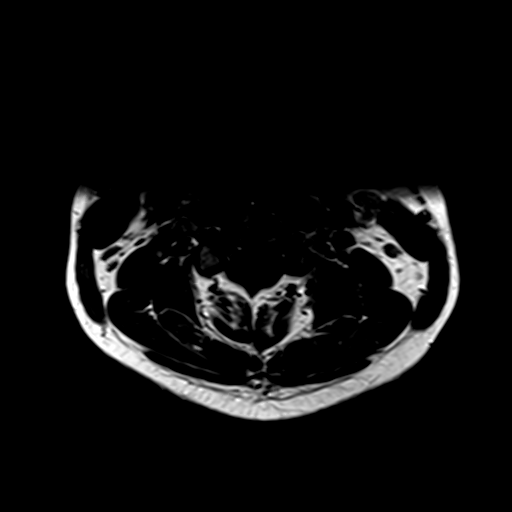
[im 35/40]
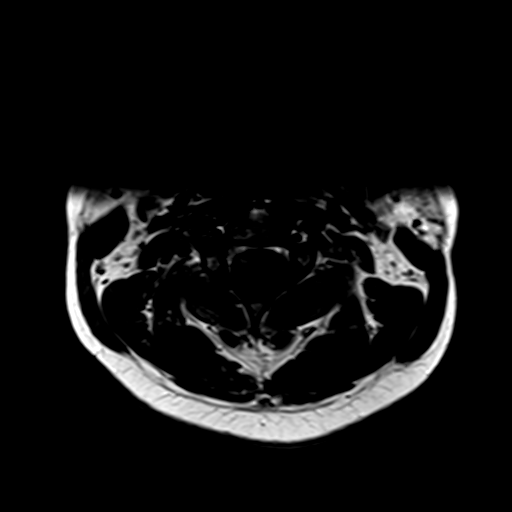
[im 40/40]
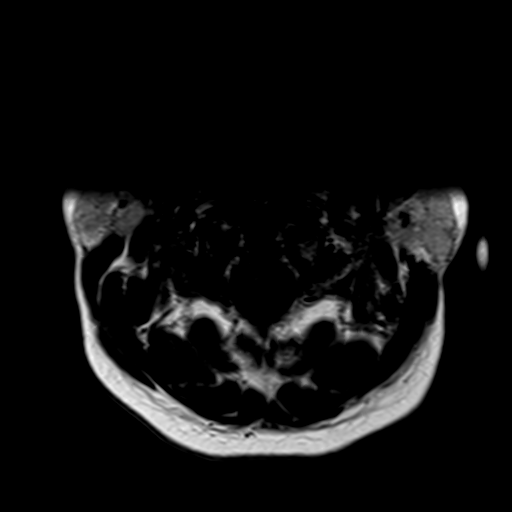

[Series 15: T1 fat-sat post-contrast · sagittal · 3.0mm · 0.43mm/px · 3 of 15 slices shown]
[im 1/15]
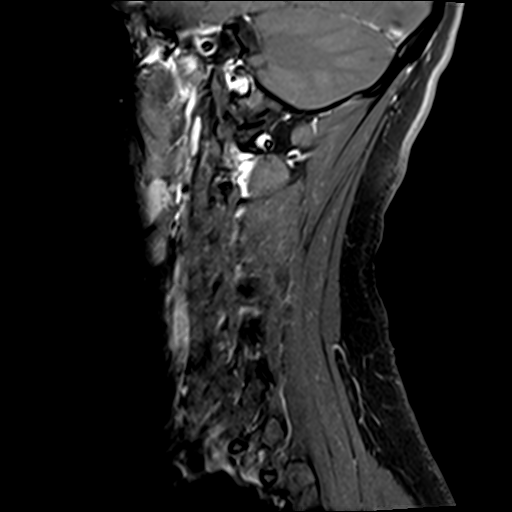
[im 8/15]
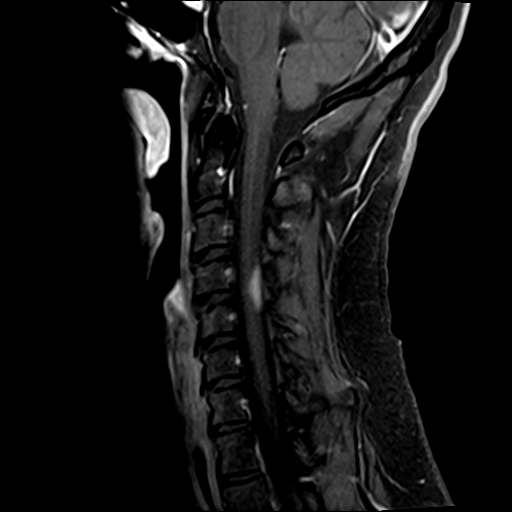
[im 15/15]
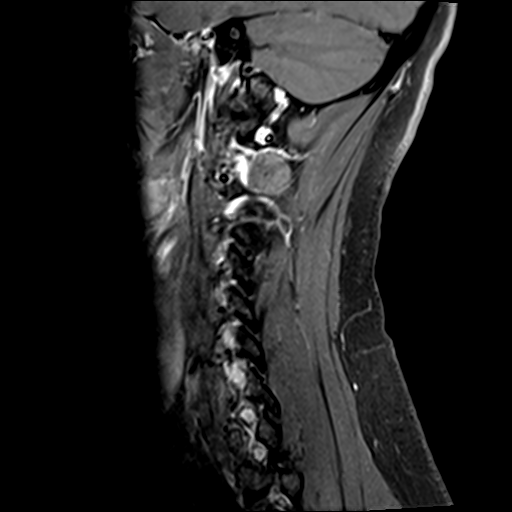

[30 of 48 positions shown; findings below may reference images not displayed]

FINDINGS: MRI HEAD FINDINGS

Brain: No acute infarct, mass effect or extra-axial collection. No
acute or chronic hemorrhage. Normal white matter signal, parenchymal
volume and CSF spaces. The midline structures are normal.

Vascular: Major flow voids are preserved.

Skull and upper cervical spine: Normal calvarium and skull base.
Visualized upper cervical spine and soft tissues are normal.

Sinuses/Orbits:No paranasal sinus fluid levels or advanced mucosal
thickening. No mastoid or middle ear effusion. Normal orbits.

MRI CERVICAL SPINE FINDINGS

Alignment: Normal

Vertebrae: Normal

Cord: Unchanged size of hyperintense T2-weighted signal lesion at
the C4-5 level. There is more distinct contrast enhancement on the
current study, which may partially be due to technical factors.

Posterior Fossa, vertebral arteries, paraspinal tissues: Negative

Disc levels: There is no disc herniation, spinal canal stenosis or
neural impingement.

MRI THORACIC SPINE FINDINGS

Alignment: Physiologic.

Vertebrae: No fracture, evidence of discitis, or bone lesion.

Cord: Normal signal and morphology.

Paraspinal soft tissues: Negative.

Disc levels: There is a small central disc protrusion at T7-8
without spinal canal stenosis.
IMPRESSION: 1. Normal MRI of the brain.
2. Unchanged size of hyperintense T2-weighted signal lesion at the
C4-5 level with more distinct contrast enhancement, again favored to
represent demyelinating disease.

## 2020-07-06 IMAGING — MR MR THORACIC SPINE WO/W CM
7 of 11 series · 28 of 48 positions shown · IV contrast (gadavist)
Comparison: [DATE]

CLINICAL DATA: Demyelinating disease

EXAM:
MRI HEAD WITHOUT AND WITH CONTRAST
MRI CERVICAL SPINE WITHOUT AND WITH CONTRAST
MRI THORACIC SPINE WITHOUT AND WITH CONTRAST
TECHNIQUE: Multiplanar, multiecho pulse sequences of the brain and surrounding
structures, cervical spine and thoracic spine were obtained without
and with intravenous contrast.
CONTRAST:  10mL GADAVIST GADOBUTROL 1 MMOL/ML IV SOLN

[Series 9: T1 · sagittal · 5.0mm · 1.46mm/px · 3 of 9 slices shown (1 of 5)]
[im 1/9]
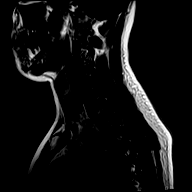
[im 5/9]
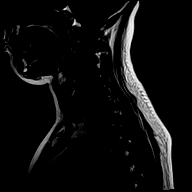
[im 9/9]
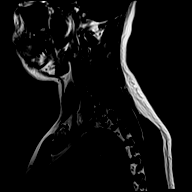

[Series 10: T1 · sagittal · 5.0mm · 1.09mm/px · 3 of 9 slices shown (2 of 5)]
[im 1/9]
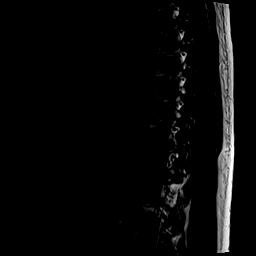
[im 5/9]
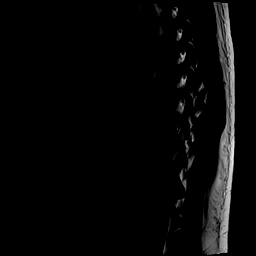
[im 9/9]
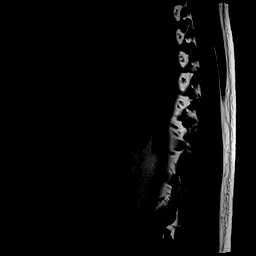

[Series 11: T1 · sagittal · 6.0mm · 1.09mm/px · 2 of 9 slices shown (3 of 5)]
[im 1/9]
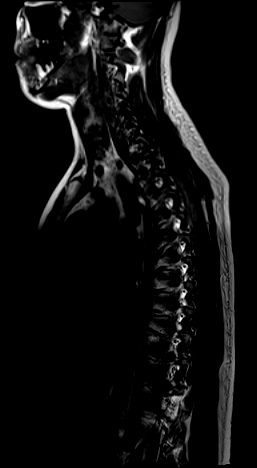
[im 9/9]
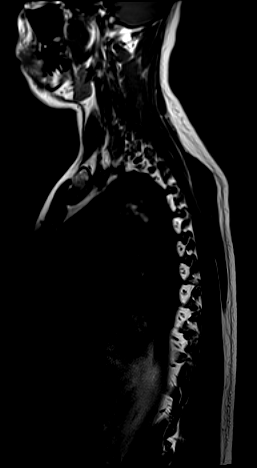

[Series 12: T2 · sagittal · 3.0mm · 0.76mm/px · 3 of 17 slices shown (1 of 2)]
[im 1/17]
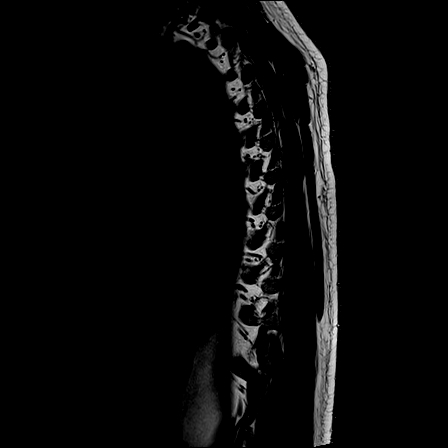
[im 9/17]
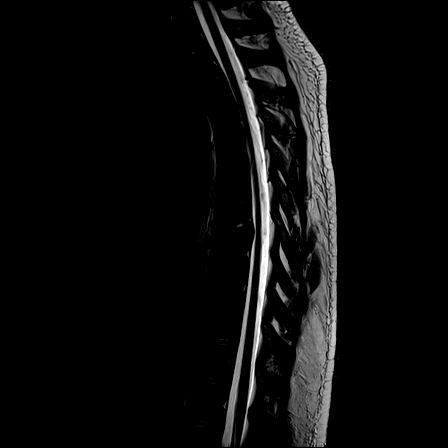
[im 17/17]
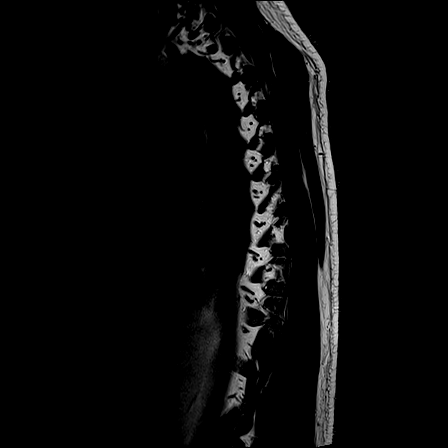

[Series 13: T1 · sagittal · 3.0mm · 0.76mm/px · 3 of 17 slices shown (4 of 5)]
[im 1/17]
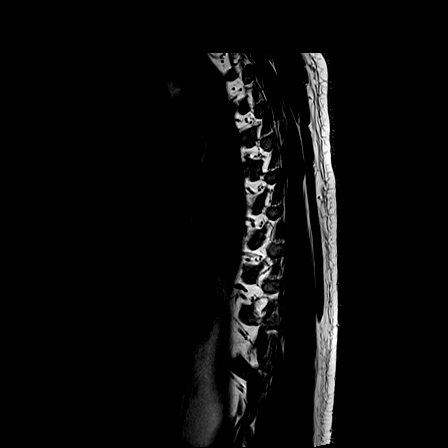
[im 9/17]
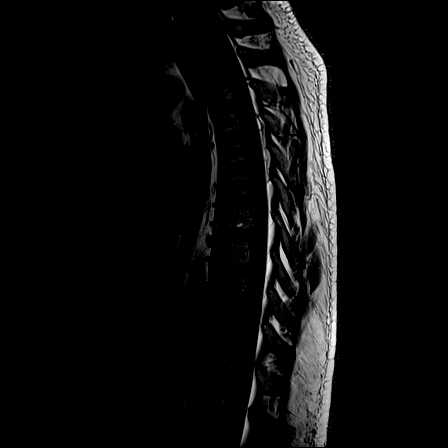
[im 17/17]
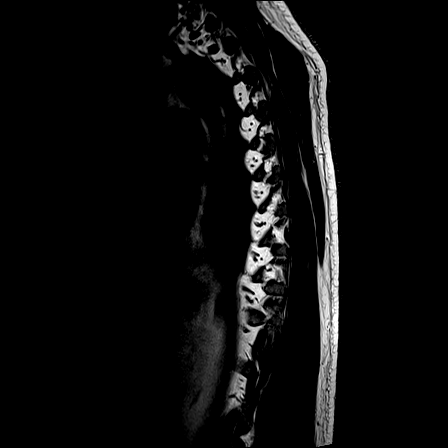

[Series 15: T2 · axial · 4.0mm · 0.59mm/px · z∈[-506,-235]mm · 7 of 39 slices shown (2 of 2)]
[im 1/39]
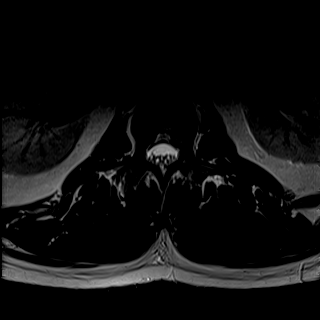
[im 7/39]
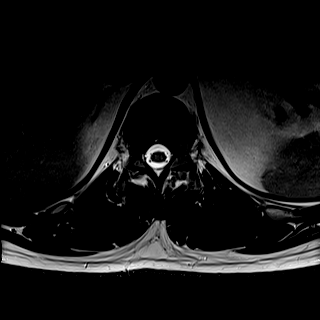
[im 13/39]
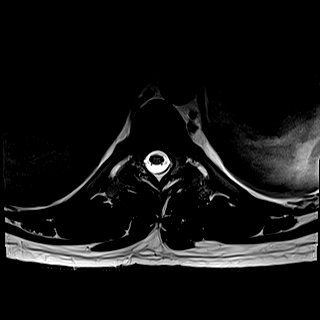
[im 20/39]
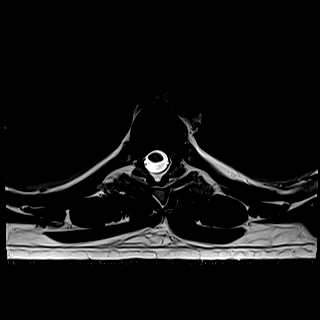
[im 26/39]
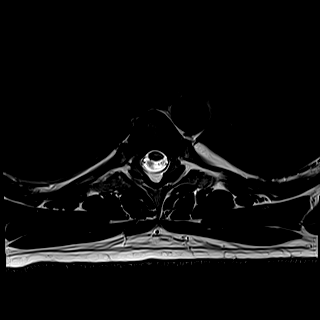
[im 32/39]
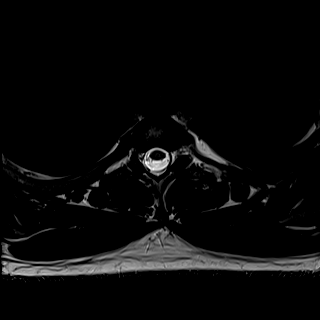
[im 39/39]
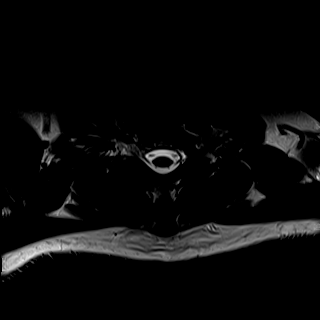

[Series 17: T1 · axial · non-contrast · 4.0mm · 0.31mm/px · z∈[-506,-235]mm · 7 of 39 slices shown (5 of 5)]
[im 1/39]
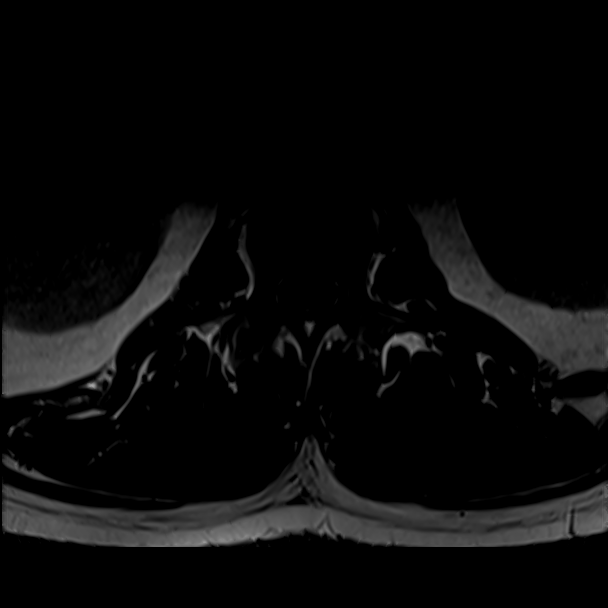
[im 7/39]
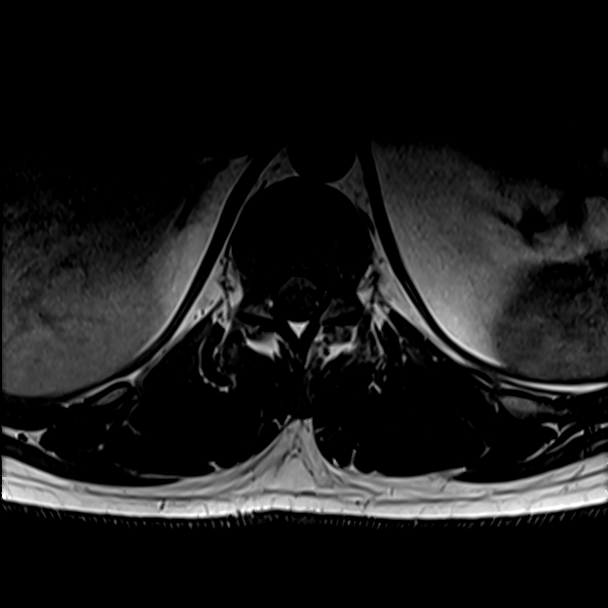
[im 13/39]
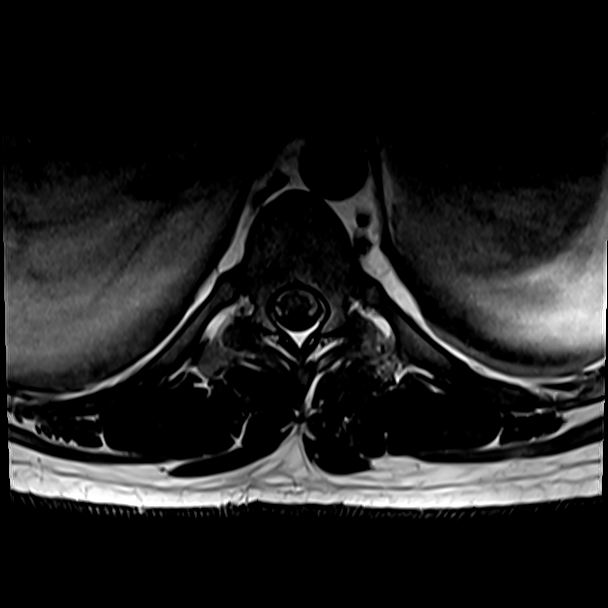
[im 20/39]
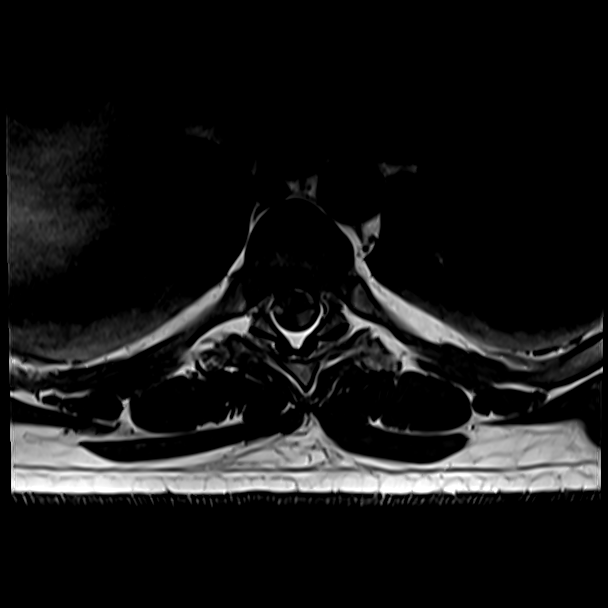
[im 26/39]
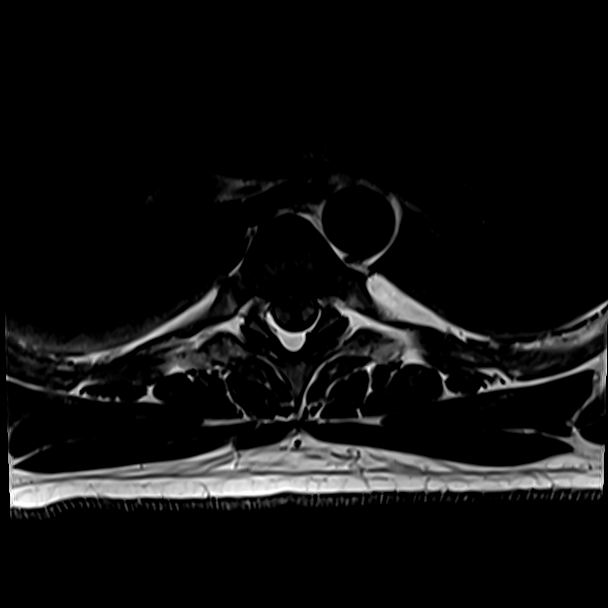
[im 32/39]
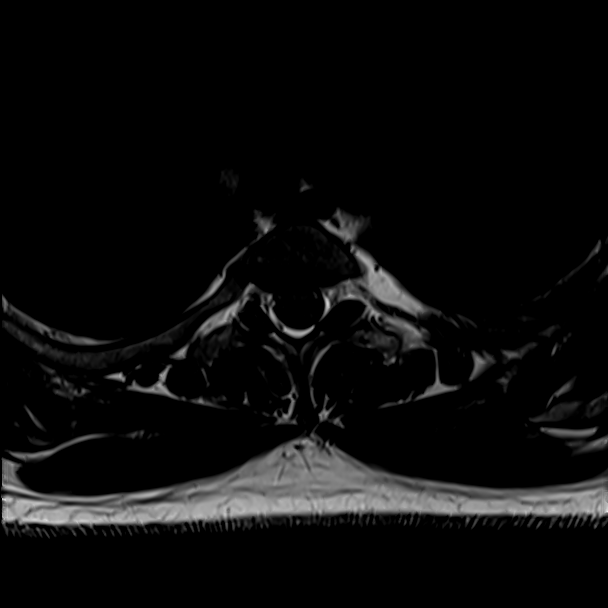
[im 39/39]
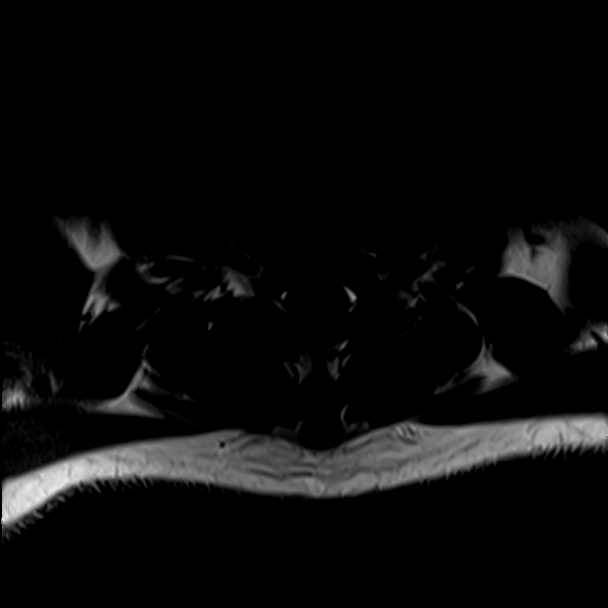

[28 of 48 positions shown; findings below may reference images not displayed]

FINDINGS: MRI HEAD FINDINGS

Brain: No acute infarct, mass effect or extra-axial collection. No
acute or chronic hemorrhage. Normal white matter signal, parenchymal
volume and CSF spaces. The midline structures are normal.

Vascular: Major flow voids are preserved.

Skull and upper cervical spine: Normal calvarium and skull base.
Visualized upper cervical spine and soft tissues are normal.

Sinuses/Orbits:No paranasal sinus fluid levels or advanced mucosal
thickening. No mastoid or middle ear effusion. Normal orbits.

MRI CERVICAL SPINE FINDINGS

Alignment: Normal

Vertebrae: Normal

Cord: Unchanged size of hyperintense T2-weighted signal lesion at
the C4-5 level. There is more distinct contrast enhancement on the
current study, which may partially be due to technical factors.

Posterior Fossa, vertebral arteries, paraspinal tissues: Negative

Disc levels: There is no disc herniation, spinal canal stenosis or
neural impingement.

MRI THORACIC SPINE FINDINGS

Alignment: Physiologic.

Vertebrae: No fracture, evidence of discitis, or bone lesion.

Cord: Normal signal and morphology.

Paraspinal soft tissues: Negative.

Disc levels: There is a small central disc protrusion at T7-8
without spinal canal stenosis.
IMPRESSION: 1. Normal MRI of the brain.
2. Unchanged size of hyperintense T2-weighted signal lesion at the
C4-5 level with more distinct contrast enhancement, again favored to
represent demyelinating disease.

## 2020-07-06 MED ORDER — GADOBUTROL 1 MMOL/ML IV SOLN
10.0000 mL | Freq: Once | INTRAVENOUS | Status: AC | PRN
  Administered 2020-07-06: 10 mL via INTRAVENOUS

## 2020-07-06 MED ORDER — ACETAMINOPHEN 325 MG PO TABS
650.0000 mg | ORAL_TABLET | Freq: Four times a day (QID) | ORAL | Status: DC | PRN
  Administered 2020-07-06: 650 mg via ORAL
  Filled 2020-07-06: qty 2

## 2020-07-06 MED ORDER — SODIUM CHLORIDE 0.9 % IV SOLN: 1000.0000 mg | Freq: Every day | INTRAVENOUS | Status: DC

## 2020-07-06 MED ORDER — SODIUM CHLORIDE 0.9 % IV SOLN
1000.0000 mg | INTRAVENOUS | Status: DC
  Filled 2020-07-06: qty 8

## 2020-07-06 MED ORDER — LIDOCAINE HCL (PF) 1 % IJ SOLN
5.0000 mL | Freq: Once | INTRAMUSCULAR | Status: AC
  Administered 2020-07-06: 5 mL via INTRADERMAL
  Filled 2020-07-06: qty 5

## 2020-07-06 NOTE — Progress Notes (Signed)
PROGRESS NOTE    Hunter Bell  ZOX:[id number] DOB: [birth year] DOA: 07/05/2020 PCP: Patient, No Pcp Per (Inactive)    Brief Narrative:  This 30 years old male with recent diagnosis of demyelinating process in the cervical spine who was recently hospitalized at [facility] on 4/2, started on high-dose steroids.  Patient had some improvement in his symptoms he was sent home on high-dose prednisone for 2 days after stopping the medication his symptoms slowly return patient reported difficulty coordinating movement in his left hand with paresthesia and weakness also reports paresthesia in the left foot and lower leg denies any triggering events.  Patient was admitted for neurological evaluation.  Neurology was consulted recommended 3 dosing with high-dose steroids.  MRI C-spine T-spine completed which was unremarkable.  Patient underwent lumbar puncture by IR under fluoroscopy.  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 Patient was admitted for neurological evaluation. Patient was started on high-dose steroids. MRI brain MRI C-spine and thoracic spine shows overall size of the lesion which is stable. Neurology consulted recommended lumbar puncture. Patient underwent lumbar puncture labs pending. Patient does not require plasmapheresis. Neurology recommended short course of steroids for 5 days. Add gabapentin for paresthesia.   DVT prophylaxis: SCDs Code Status: Full code Family Communication: No family at bedside Disposition Plan:  Status is: Inpatient  Remains inpatient appropriate because:Inpatient level of care appropriate due to severity of illness   Dispo: The patient is from: Home              Anticipated d/c is to: Home              Patient currently is not medically stable to d/c.    Difficult to place patient No   Consultants:   Neurology  Procedures: Lumbar puncture.  MRI C-spine Antimicrobials: Anti-infectives (From admission, onward)   None      Subjective: Seen and examined at bedside.  Patient reports feeling better, denies any dizziness, weakness.  Objective: Vitals:   07/05/20 1932 07/05/20 2248 07/06/20 0245 07/06/20 1423  BP: 126/80 (!) 143/92 131/85 (!) 155/78  Pulse: 77 79 (P) 84 100  Resp: 17 15 18 16   Temp:  98.1 F (36.7 C) (P) 98.2 F (36.8 C) 98.7 F (37.1 C)  TempSrc:  Oral (P) Oral Oral  SpO2: 94% 93% 91%   Weight:      Height:        Intake/Output Summary (Last 24 hours) at 07/06/2020 1617 Last data filed at 07/06/2020 1610 Gross per 24 hour  Intake 720 ml  Output 1700 ml  Net -980 ml   Filed Weights   07/05/20 1418  Weight: 108.4 kg    Examination:  General exam: Appears calm and comfortable, not in any acute distress. Respiratory system: Clear to auscultation. Respiratory effort normal. Cardiovascular system: S1 & S2 heard, RRR. No JVD, murmurs, rubs, gallops or clicks. No pedal edema. Gastrointestinal system: Abdomen is nondistended, soft and nontender. No organomegaly or masses felt. Normal bowel sounds heard. Central nervous system: Alert and oriented. No focal neurological deficits. Extremities: Symmetric 5 x 5 power. Skin: No rashes, lesions or ulcers Psychiatry: Judgement and insight appear normal. Mood & affect appropriate.     Data Reviewed: I have personally reviewed following labs and imaging studies  CBC: Recent Labs  Lab 07/05/20 1458  WBC  13.5*  NEUTROABS 8.3*  HGB 17.3*  HCT 50.5  MCV 89.4  PLT 278   Basic Metabolic Panel: Recent Labs  Lab 07/05/20 1458  NA 137  K 3.5  CL 100  CO2 26  GLUCOSE 104*  BUN 17  CREATININE 1.20  CALCIUM 8.7*   GFR: Estimated Creatinine Clearance: 116.2 mL/min (by C-G formula based on SCr of 1.2 mg/dL). Liver Function Tests: Recent Labs  Lab  07/05/20 1458  AST 22  ALT 53*  ALKPHOS 63  BILITOT 0.7  PROT 7.1  ALBUMIN 3.9   No results for input(s): LIPASE, AMYLASE in the last 168 hours. No results for input(s): AMMONIA in the last 168 hours. Coagulation Profile: No results for input(s): INR, PROTIME in the last 168 hours. Cardiac Enzymes: No results for input(s): CKTOTAL, CKMB, CKMBINDEX, TROPONINI in the last 168 hours. BNP (last 3 results) No results for input(s): PROBNP in the last 8760 hours. HbA1C: No results for input(s): HGBA1C in the last 72 hours. CBG: No results for input(s): GLUCAP in the last 168 hours. Lipid Profile: No results for input(s): CHOL, HDL, LDLCALC, TRIG, CHOLHDL, LDLDIRECT in the last 72 hours. Thyroid Function Tests: No results for input(s): TSH, T4TOTAL, FREET4, T3FREE, THYROIDAB in the last 72 hours. Anemia Panel: No results for input(s): VITAMINB12, FOLATE, FERRITIN, TIBC, IRON, RETICCTPCT in the last 72 hours. Sepsis Labs: No results for input(s): PROCALCITON, LATICACIDVEN in the last 168 hours.  Recent Results (from the past 240 hour(s))  Resp Panel by RT-PCR (Flu A&B, Covid) Nasopharyngeal Swab     Status: None   Collection Time: 06/27/20  4:59 PM   Specimen: Nasopharyngeal Swab; Nasopharyngeal(NP) swabs in vial transport medium  Result Value Ref Range Status   SARS Coronavirus 2 by RT PCR NEGATIVE NEGATIVE Final    Comment: (NOTE) SARS-CoV-2 target nucleic acids are NOT DETECTED.  The SARS-CoV-2 RNA is generally detectable in upper respiratory specimens during the acute phase of infection. The lowest concentration of SARS-CoV-2 viral copies this assay can detect is 138 copies/mL. A negative result does not preclude SARS-Cov-2 infection and should not be used as the sole basis for treatment or other patient management decisions. A negative result may occur with  improper specimen collection/handling, submission of specimen other than nasopharyngeal swab, presence of viral  mutation(s) within the areas targeted by this assay, and inadequate number of viral copies(<138 copies/mL). A negative result must be combined with clinical observations, patient history, and epidemiological information. The expected result is Negative.  Fact Sheet for Patients:  BloggerCourse.com  Fact Sheet for Healthcare Providers:  SeriousBroker.it  This test is no t yet approved or cleared by the [place] FDA and  has been authorized for detection and/or diagnosis of SARS-CoV-2 by FDA under an Emergency Use Authorization (EUA). This EUA will remain  in effect (meaning this test can be used) for the duration of the COVID-19 declaration under Section 564(b)(1) of the Act, 21 U.S.C.section 360bbb-3(b)(1), unless the authorization is terminated  or revoked sooner.       Influenza A by PCR NEGATIVE NEGATIVE Final   Influenza B by PCR NEGATIVE NEGATIVE Final    Comment: (NOTE) The Xpert Xpress SARS-CoV-2/FLU/RSV plus assay is intended as an aid in the diagnosis of influenza from Nasopharyngeal swab specimens and should not be used as a sole basis for treatment. Nasal washings and aspirates are unacceptable for Xpert Xpress SARS-CoV-2/FLU/RSV testing.  Fact Sheet for Patients: BloggerCourse.com  Fact Sheet for Healthcare Providers: SeriousBroker.it  This  test is not yet approved or cleared by the [place] and has been authorized for detection and/or diagnosis of SARS-CoV-2 by FDA under an Emergency Use Authorization (EUA). This EUA will remain in effect (meaning this test can be used) for the duration of the COVID-19 declaration under Section 564(b)(1) of the Act, 21 U.S.C. section 360bbb-3(b)(1), unless the authorization is terminated or revoked.  Performed at [facility], [address], [place], [place] [address]   Resp Panel by RT-PCR (Flu A&B,  Covid) Nasopharyngeal Swab     Status: None   Collection Time: 07/05/20  3:57 PM   Specimen: Nasopharyngeal Swab; Nasopharyngeal(NP) swabs in vial transport medium  Result Value Ref Range Status   SARS Coronavirus 2 by RT PCR NEGATIVE NEGATIVE Final    Comment: (NOTE) SARS-CoV-2 target nucleic acids are NOT DETECTED.  The SARS-CoV-2 RNA is generally detectable in upper respiratory specimens during the acute phase of infection. The lowest concentration of SARS-CoV-2 viral copies this assay can detect is 138 copies/mL. A negative result does not preclude SARS-Cov-2 infection and should not be used as the sole basis for treatment or other patient management decisions. A negative result may occur with  improper specimen collection/handling, submission of specimen other than nasopharyngeal swab, presence of viral mutation(s) within the areas targeted by this assay, and inadequate number of viral copies(<138 copies/mL). A negative result must be combined with clinical observations, patient history, and epidemiological information. The expected result is Negative.  Fact Sheet for Patients:  BloggerCourse.com  Fact Sheet for Healthcare Providers:  SeriousBroker.it  This test is no t yet approved or cleared by the [place] FDA and  has been authorized for detection and/or diagnosis of SARS-CoV-2 by FDA under an Emergency Use Authorization (EUA). This EUA will remain  in effect (meaning this test can be used) for the duration of the COVID-19 declaration under Section 564(b)(1) of the Act, 21 U.S.C.section 360bbb-3(b)(1), unless the authorization is terminated  or revoked sooner.       Influenza A by PCR NEGATIVE NEGATIVE Final   Influenza B by PCR NEGATIVE NEGATIVE Final    Comment: (NOTE) The Xpert Xpress SARS-CoV-2/FLU/RSV plus assay is intended as an aid in the diagnosis of influenza from Nasopharyngeal swab specimens and should  not be used as a sole basis for treatment. Nasal washings and aspirates are unacceptable for Xpert Xpress SARS-CoV-2/FLU/RSV testing.  Fact Sheet for Patients: BloggerCourse.com  Fact Sheet for Healthcare Providers: SeriousBroker.it  This test is not yet approved or cleared by the [place] FDA and has been authorized for detection and/or diagnosis of SARS-CoV-2 by FDA under an Emergency Use Authorization (EUA). This EUA will remain in effect (meaning this test can be used) for the duration of the COVID-19 declaration under Section 564(b)(1) of the Act, 21 U.S.C. section 360bbb-3(b)(1), unless the authorization is terminated or revoked.  Performed at [facility], [address], [place], [place] [address]   CSF culture w Gram Stain     Status: None (Preliminary result)   Collection Time: 07/06/20 10:12 AM   Specimen: PATH Cytology CSF; Cerebrospinal Fluid  Result Value Ref Range Status   Specimen Description CSF  Final   Special Requests NONE  Final   Gram Stain   Final    NO WBC SEEN NO ORGANISMS SEEN Performed at [facility], [address], [place], [place] [address]    Culture PENDING  Incomplete   Report Status PENDING  Incomplete  Radiology Studies: MR BRAIN W WO CONTRAST  Result Date: 07/06/2020 CLINICAL DATA:  Demyelinating disease EXAM: MRI HEAD WITHOUT AND WITH CONTRAST MRI CERVICAL SPINE WITHOUT AND WITH CONTRAST MRI THORACIC SPINE WITHOUT AND WITH CONTRAST TECHNIQUE: Multiplanar, multiecho pulse sequences of the brain and surrounding structures, cervical spine and thoracic spine were obtained without and with intravenous contrast. CONTRAST:  22mL GADAVIST GADOBUTROL 1 MMOL/ML IV SOLN COMPARISON:  06/27/2020 FINDINGS: MRI HEAD FINDINGS Brain: No acute infarct, mass effect or extra-axial collection. No acute or chronic hemorrhage. Normal white matter signal, parenchymal volume and CSF spaces. The midline  structures are normal. Vascular: Major flow voids are preserved. Skull and upper cervical spine: Normal calvarium and skull base. Visualized upper cervical spine and soft tissues are normal. Sinuses/Orbits:No paranasal sinus fluid levels or advanced mucosal thickening. No mastoid or middle ear effusion. Normal orbits. MRI CERVICAL SPINE FINDINGS Alignment: Normal Vertebrae: Normal Cord: Unchanged size of hyperintense T2-weighted signal lesion at the C4-5 level. There is more distinct contrast enhancement on the current study, which may partially be due to technical factors. Posterior Fossa, vertebral arteries, paraspinal tissues: Negative Disc levels: There is no disc herniation, spinal canal stenosis or neural impingement. MRI THORACIC SPINE FINDINGS Alignment: Physiologic. Vertebrae: No fracture, evidence of discitis, or bone lesion. Cord: Normal signal and morphology. Paraspinal soft tissues: Negative. Disc levels: There is a small central disc protrusion at T7-8 without spinal canal stenosis. IMPRESSION: 1. Normal MRI of the brain. 2. Unchanged size of hyperintense T2-weighted signal lesion at the C4-5 level with more distinct contrast enhancement, again favored to represent demyelinating disease. Electronically Signed   By: Deatra Robinson M.D.   On: 07/06/2020 02:53   MR CERVICAL SPINE W WO CONTRAST  Result Date: 07/06/2020 CLINICAL DATA:  Demyelinating disease EXAM: MRI HEAD WITHOUT AND WITH CONTRAST MRI CERVICAL SPINE WITHOUT AND WITH CONTRAST MRI THORACIC SPINE WITHOUT AND WITH CONTRAST TECHNIQUE: Multiplanar, multiecho pulse sequences of the brain and surrounding structures, cervical spine and thoracic spine were obtained without and with intravenous contrast. CONTRAST:  60mL GADAVIST GADOBUTROL 1 MMOL/ML IV SOLN COMPARISON:  06/27/2020 FINDINGS: MRI HEAD FINDINGS Brain: No acute infarct, mass effect or extra-axial collection. No acute or chronic hemorrhage. Normal white matter signal, parenchymal volume  and CSF spaces. The midline structures are normal. Vascular: Major flow voids are preserved. Skull and upper cervical spine: Normal calvarium and skull base. Visualized upper cervical spine and soft tissues are normal. Sinuses/Orbits:No paranasal sinus fluid levels or advanced mucosal thickening. No mastoid or middle ear effusion. Normal orbits. MRI CERVICAL SPINE FINDINGS Alignment: Normal Vertebrae: Normal Cord: Unchanged size of hyperintense T2-weighted signal lesion at the C4-5 level. There is more distinct contrast enhancement on the current study, which may partially be due to technical factors. Posterior Fossa, vertebral arteries, paraspinal tissues: Negative Disc levels: There is no disc herniation, spinal canal stenosis or neural impingement. MRI THORACIC SPINE FINDINGS Alignment: Physiologic. Vertebrae: No fracture, evidence of discitis, or bone lesion. Cord: Normal signal and morphology. Paraspinal soft tissues: Negative. Disc levels: There is a small central disc protrusion at T7-8 without spinal canal stenosis. IMPRESSION: 1. Normal MRI of the brain. 2. Unchanged size of hyperintense T2-weighted signal lesion at the C4-5 level with more distinct contrast enhancement, again favored to represent demyelinating disease. Electronically Signed   By: Deatra Robinson M.D.   On: 07/06/2020 02:53   MR THORACIC SPINE W WO CONTRAST  Result Date: 07/06/2020 CLINICAL DATA:  Demyelinating disease EXAM: MRI HEAD WITHOUT AND WITH  CONTRAST MRI CERVICAL SPINE WITHOUT AND WITH CONTRAST MRI THORACIC SPINE WITHOUT AND WITH CONTRAST TECHNIQUE: Multiplanar, multiecho pulse sequences of the brain and surrounding structures, cervical spine and thoracic spine were obtained without and with intravenous contrast. CONTRAST:  78mL GADAVIST GADOBUTROL 1 MMOL/ML IV SOLN COMPARISON:  06/27/2020 FINDINGS: MRI HEAD FINDINGS Brain: No acute infarct, mass effect or extra-axial collection. No acute or chronic hemorrhage. Normal white  matter signal, parenchymal volume and CSF spaces. The midline structures are normal. Vascular: Major flow voids are preserved. Skull and upper cervical spine: Normal calvarium and skull base. Visualized upper cervical spine and soft tissues are normal. Sinuses/Orbits:No paranasal sinus fluid levels or advanced mucosal thickening. No mastoid or middle ear effusion. Normal orbits. MRI CERVICAL SPINE FINDINGS Alignment: Normal Vertebrae: Normal Cord: Unchanged size of hyperintense T2-weighted signal lesion at the C4-5 level. There is more distinct contrast enhancement on the current study, which may partially be due to technical factors. Posterior Fossa, vertebral arteries, paraspinal tissues: Negative Disc levels: There is no disc herniation, spinal canal stenosis or neural impingement. MRI THORACIC SPINE FINDINGS Alignment: Physiologic. Vertebrae: No fracture, evidence of discitis, or bone lesion. Cord: Normal signal and morphology. Paraspinal soft tissues: Negative. Disc levels: There is a small central disc protrusion at T7-8 without spinal canal stenosis. IMPRESSION: 1. Normal MRI of the brain. 2. Unchanged size of hyperintense T2-weighted signal lesion at the C4-5 level with more distinct contrast enhancement, again favored to represent demyelinating disease. Electronically Signed   By: Deatra Robinson M.D.   On: 07/06/2020 02:53   DG FL GUIDED LUMBAR PUNCTURE  Result Date: 07/06/2020 CLINICAL DATA:  Demyelinating disease. EXAM: DIAGNOSTIC LUMBAR PUNCTURE UNDER FLUOROSCOPIC GUIDANCE COMPARISON:  None. FLUOROSCOPY TIME:  Fluoroscopy Time:  0 minutes 24 seconds Radiation Exposure Index (if provided by the fluoroscopic device): Number of Acquired Spot Images: 0 PROCEDURE: Informed consent was obtained from the patient prior to the procedure, including potential complications of headache, allergy, and pain. With the patient prone, the lower back was prepped with Betadine. 1% Lidocaine was used for local anesthesia.  Lumbar puncture was performed at the L4-5 level using a 20 gauge needle with return of clear CSF with an opening pressure of 12 cm water. 9 ml of CSF were obtained for laboratory studies. The patient tolerated the procedure well and there were no apparent complications. IMPRESSION: Successful lumbar puncture under fluoroscopy. Electronically Signed   By: Leanna Battles M.D.   On: 07/06/2020 10:45    Scheduled Meds: . pantoprazole (PROTONIX) IV  40 mg Intravenous Q24H  . cyanocobalamin  1,000 mcg Oral Daily  . Vitamin D (Ergocalciferol)  50,000 Units Oral Q7 days   Continuous Infusions:   LOS: 1 day    Time spent: 25 mins    Cipriano Bunker, MD [facility]   If 7PM-7AM, please contact night-coverage

## 2020-07-06 NOTE — Progress Notes (Addendum)
Neurology Progress Note  S: No overnight events. He states his left hand is feeling better in regard to being able to grasp and feel his fingers. He can also feel his toes now but states the bottom of both feet feel like a sock with sand in hit on various places. Still; having numbness and tingling to his left hand thumb and index finger and his toes but his sensation has returned.   Brief HPI: Patient is familiar to NP because he was here about a week ago for similar and actually worse symptoms. He had high dose steroids in the hospital and was sent home on a 2 day course of high dose po steroids. When he left the hospital, he was 60%/100%, for 2 days after that, he was 70%/100%. Then he went back to work. Symptoms began to resurface that day he felt bad, having nausea and that feeling of someone holding their hand on his abdomen which was like 1st hospitalization. He was also conscious with walking as he needed to concentrate because it was hard to discern the bottom of his feet touching the floor. His LUE became heavy described as a 50 lb cast being on it. The urinary and bowel movement issues he was having at last hospitalization have subsided. He feels pins and needles in his left thumb and index finger and to his toes bilaterally, but both of these have improved since admission.   LP was attempted at bedside yesterday without success. A flouro guided LP is planned for this am and CSF testing is already ordered. NP called radiology to make sure they know the CSF orders are in the computer.   O: Current vital signs: BP (P) 131/85 (BP Location: Right Arm)   Pulse (P) 84   Temp (P) 98.2 F (36.8 C) (Oral)   Resp 18   Ht 6\' 1"  (1.854 m)   Wt 108.4 kg   SpO2 91%   BMI 31.53 kg/m  Vital signs in last 24 hours: Temp:  [98.1 F (36.7 C)-98.2 F (36.8 C)] (P) 98.2 F (36.8 C) (04/11 0245) Pulse Rate:  [77-86] (P) 84 (04/11 0245) Resp:  [15-18] 18 (04/11 0245) BP: (126-143)/(80-100) 131/85  (04/11 0245) SpO2:  [91 %-97 %] 91 % (04/11 0245) Weight:  [108.4 kg] 108.4 kg (04/10 1418)  GENERAL: Awake, alert in NAD. Appears well.  HEENT: Normocephalic and atraumatic LUNGS: Normal respiratory effort.  CV: RRR  ABDOMEN: Soft, nontender Ext: warm Psych: Affect is slightly flat as he appears to be frustrated which is expected.    NEURO:  Mental Status: AA&Ox3  Speech/Language: speech is without aphasia or dysarthria.  Naming, repetition, fluency, and comprehension intact.  Cranial Nerves:  II: PERRL. Visual fields full.  III, IV, VI: EOMI. Eyelids elevate symmetrically.  V: Sensation is intact to light touch and symmetrical to face.  VII: Smile is symmetrical. Able to puff cheeks and raise eyebrows.  VIII: hearing intact to voice. IX, X: Palate elevates symmetrically. Phonation is normal.  XI: Shoulder shrug 5/5. XII: tongue is midline without fasciculations. Motor: RUE 5/5 all muscle groups. LUE grip 4/5, biceps 4/5 and triceps 4-/5. BLEs 5/5.  Left Fingers left are weak than and pincer grasp is weak on the left.  Tone: is normal and bulk is normal Sensation- Intact to light touch bilaterally. Extinction absent to light touch to DSS. Vibratory sense is present to all extremities. Cold sense is present to all extremities.   Coordination: FTN intact bilaterally, finger rolling, alternating  hand movements intact. No drift.  DTRs: Absent left biceps, otherwise 2+ throughout Gait- deferred  Medications  Current Facility-Administered Medications:  .  acetaminophen (TYLENOL) tablet 650 mg, 650 mg, Oral, Q6H PRN, Cipriano Bunker, MD .  methylPREDNISolone sodium succinate (SOLU-MEDROL) 1,000 mg in sodium chloride 0.9 % 50 mL IVPB, 1,000 mg, Intravenous, Q24H, Bhagat, Srishti L, MD .  Melene Muller ON 07/07/2020] methylPREDNISolone sodium succinate (SOLU-MEDROL) 1,000 mg in sodium chloride 0.9 % 50 mL IVPB, 1,000 mg, Intravenous, Daily, Bhagat, Srishti L, MD .  ondansetron (ZOFRAN) tablet 4  mg, 4 mg, Oral, Q6H PRN **OR** ondansetron (ZOFRAN) injection 4 mg, 4 mg, Intravenous, Q6H PRN, Stinson, Jacob J, DO .  pantoprazole (PROTONIX) injection 40 mg, 40 mg, Intravenous, Q24H, Levie Heritage, DO, 40 mg at 07/05/20 2352 .  vitamin B-12 (CYANOCOBALAMIN) tablet 1,000 mcg, 1,000 mcg, Oral, Daily, Levie Heritage, DO, 1,000 mcg at 07/06/20 0858 .  Vitamin D (Ergocalciferol) (DRISDOL) capsule 50,000 Units, 50,000 Units, Oral, Q7 days, Levie Heritage, DO, 50,000 Units at 07/06/20 3614  Pertinent Labs HIV, RPR were negative on last hospital stay. Vit B12 and Vit D were low last stay.  Copper, Zinc normal last stay. NMO IgG < 1.5 last stay.  Imaging  MRI Brain/Cspine/Tspine  1. Normal MRI of the brain. 2. Unchanged size of hyperintense T2-weighted signal lesion at the C4-5 level with more distinct contrast enhancement, again favored to represent demyelinating disease.  Assessment: 30 yo male who was just admitted last week for these same symptoms. Did well on high dose steroids here and at home, but unfortunately had return of some of his previous symptoms causing him to come back yesterday. His exam today seems to be more intact than when he presented at last hospitalization. His MRIs showed same lesion as last stay which likely makes this a clinically isolated syndrome. Awaiting LP results accomplished today. MS remains on the differential even though no further lesions are noted.   Impression- 1. 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 MS is on differential given transverse myelitis may be present at first in patients with MS.    Plan:  1. LP under flouro today-see above orders.  -Lumbar puncture orders:              -Cell count, protein, glucose, gram stain/culture, (ordered)             -VZV PCR and IgG, HSV1/2, EBV, enterovirus D68 and EV71, HTLV-1,  Lyme, VDRL, (ordered)             -oligoclonal bands and IgG index (ordered)             -consider flash freezing 5 mL for advanced analysis at [facility] (stroed at ideally -80 or at minimum -20 C, to be sent only if patient continues to have refractory course) -ANA, ds-DNA, Ro/SSA, La/SSB, Sm, RNP, antiphospholipid Ab panel (ordered)with recurrent symptoms after high dose steroid course-continue workup. Will start PLEX qod  X 10 days (total of 5 treatments).  2. Vitamin B12 and D deficiency-continue repleting.  3. PT/OT and follow recommendations for therapy post hospitalization.   D/c steroids as his symptoms returned after previous high dose course.  Discussed with patient.   Pt seen by Jimmye Norman, MSN, APN-BC/Nurse Practitioner/Neuro and later by MD. Note and plan to be edited as needed by MD.  Pager: [phone]  I have  seen the patient and reviewed the above note.  After discussing with the patient, he has been having steady improvement since leaving the hospital with the exception of while at work.  He states that while he was at work, his left arm felt like it became heavier and he had increased paresthesias/numbness of his lower extremities.  Once he went to bed that night, and he awoke the next day he was back to his improving course.  Of note, he does work in a rather hot environment.  I had him demonstrate his digit opposition on his left hand and he states that this is markedly improved from the day that he left the hospital.   Given that his overall course continues to be one of improvement, I would not favor more aggressive therapy such as PLEX. I would recommend a short steroid taper(e.g. 50mg  of prednisone tomorrow, then decrease by 10mg  per day for 5 day course).   Also could use gabapentin for paresthesia.   If he were to actually start worsening, I would not repeat high dose steroids, but could consider PLEX.   No further inpatient  recommendations at this time, can follow up CSF labs as an outpatient.   , MD [facility] [phone]  If 7pm- 7am, please page neurology on call as listed in AMION.

## 2020-07-06 NOTE — Progress Notes (Signed)
Patient ID: Hunter Bell, male   DOB: 01/16/1991, 30 y.o.   MRN: [id number] CLINICAL DATA: [Demyelinating disease.] EXAM: DIAGNOSTIC LUMBAR PUNCTURE UNDER FLUOROSCOPIC GUIDANCE COMPARISON: [None.] FLUOROSCOPY TIME: Fluoroscopy Time: [0 minutes 24 seconds] Radiation Exposure Index (if provided by the fluoroscopic device): []  Number of Acquired Spot Images: [0] PROCEDURE: Informed consent was obtained from the patient prior to the procedure, including potential complications of headache, allergy, and pain. With the patient prone, the lower back was prepped with Betadine. 1% Lidocaine was used for local anesthesia. Lumbar puncture was performed at the [L4-5] level using a [20] gauge needle with return of [clear] CSF with an opening pressure of [12] cm water. [9] ml of CSF were obtained for laboratory studies. The patient tolerated the procedure well and there were no apparent complications.  IMPRESSION: [Successful lumbar puncture under fluoroscopy.]

## 2020-07-06 NOTE — Consult Note (Signed)
Neurology Consultation Reason for Consult: Worsening sensory symptoms and weakness  Requesting Physician: Candelaria Celeste  CC: Worsening symptoms  History is obtained from: Patient and chart review  HPI: Hunter Bell is a 30 y.o. male with a recent diagnosis of transverse myelitis who returns with worsening neurological symptoms  He was originally seen at [facility], ED on 06/24/2020 for progressive paresthesias.  Head CT was completed which was negative for acute intracranial process, and then he left the ED due to wait time of 2 hours for an MRI.  Due to progressive symptoms he then came to [facility], ED on 4/1.  Per Dr. Tollie Eth note, personally confirmed with the patient, his symptoms began in late March as burning allodynia in the left upper extremity followed by numbness/tingling with the right deltoid/shoulder affected initially progressing to half of his left hand on the side of his thumb, pointer and middle finger.  This then progressed to involving his abdomen and left lower extremity, followed by progression into the trunk perineum, right upper extremity and in the fourth and fifth fingers of the right hand.  Right-sided symptoms were not as severe as the left.  Regarding bowel and bladder function, he could sense when he needed to void but could not feel the actual voiding process itself. Admission examination was notable for left hand incoordination and weakness, decreased vibration and proprioception as well as fine touch in the left upper and lower extremity more than the right upper and lower extremity, with reflexes intact except for the left biceps.  At time of discharge she felt about 60 to 65% improved.  MRI brain and C-spine were obtained (personally reviewed) with and without contrast revealed an isolated lesion of the cervical spine, C4-C5, in the dorsal spinal cord left greater than right, irregularly enhancing and slightly expansile concerning for demyelination, infection or  tumor.  The following additional work-up was completed: NMO antibody returned negative, anti-Mog antibody still in process RPR negative HIV negative B12 low at 158 and on supplementation Vitamin D low at 12.35, now on multivitamin Copper normal at 98 Zinc normal at 95 Enterovirus and West Nile virus labs appear to have been discontinued  He was treated inpatient with 3 days of IV methylprednisolone, with marked improvement in his symptoms (about 55 to 60% improvement from his nadir) and he was discharged on 2 days of p.o. prednisolone (4/2 through 4/4, 4/5 through 4/6 respectively); he preferred to return home and have p.o. completion of his treatment given family responsibilities (67-month-old and 45-year-old children at home).  He notes that he was able to tolerate taking all of his oral prednisone and additionally continued his vitamins as prescribed.  He felt that he continued to make improvement as he completed his steroids reaching about 70% of his baseline by day 5, at which time he did go back to work and worked a full 12-hour shift at a [employer].  He felt fine on Wednesday morning as well but towards the end of the day he began to feel a little bit off, feeling worse when he woke up Thursday and progressively worse throughout the day.  By Friday he felt back down to 20 to 30% function (with 0% being the nadir he had initially come in to [facility] with).  Specifically he was noticing increased numbness and stumbling.  He would not notice that his flip-flop had fallen off of his left foot he would not notice that he was walking on the knuckles of his  feet instead of his feet and he would have difficulty picking up his child and changing her diapers.  While he was off on Wednesday and Thursday from work, he did again go to his scheduled shift on Friday and was sent home by his boss given his stumbling gait.  As his wife was scheduled to work on Saturday he stayed home to take care of  the children that date and did not present to the ED again until Sunday.  Interestingly he notes that while he continued to feel at 20 to 30% of baseline on Saturday by Sunday he was actually beginning to feel better (40 to 50% of his baseline) even before coming back to the emergency department.  He notes that while the numbness and stumbling had worsened his urination and defecation have remained stable he improved since his first course of steroids and he is able to now sense himself voiding in addition to knowing that he needs to void.  He also notes he has been having increased itching in the left upper extremity in particular and has scratched to the point of skin injury at some point.  He denies any other new neurological symptoms including vision issues, hearing issues, speech or swallow issues, dizziness or lightheadedness.  He denies any fevers or chills.  He denies any Lhermitte sign.  He presented to [facility], ED, was treated with 1 g of methylprednisolone at 18:32 and transferred to [facility] for urgent MRI and further neurological evaluation   Lab Results  Component Value Date   VITAMINB12 158 (L) 06/27/2020   ROS: All other review of systems was negative except as noted in the HPI.   History reviewed. No pertinent past medical history. other than described above  History reviewed. No pertinent surgical history.  Current Outpatient Medications  Medication Instructions  . pantoprazole (PROTONIX) 40 mg, Oral, Daily  . predniSONE (DELTASONE) 1,250 mg, Oral, Daily with lunch  . vitamin B-12 (CYANOCOBALAMIN) 1,000 mcg, Oral, Daily  . Vitamin D (Ergocalciferol) (DRISDOL) 50,000 Units, Oral, Every 7 days    Family History  Problem Relation Age of Onset  . Multiple sclerosis Neg Hx    Social History:  reports that he has never smoked. He has never used smokeless tobacco. He reports current alcohol use. He reports that he does not use drugs.  Exam: Current vital  signs: BP (!) 143/92 (BP Location: Left Arm)   Pulse 79   Temp 98.1 F (36.7 C) (Oral)   Resp 15   Ht 6\' 1"  (1.854 m)   Wt 108.4 kg   SpO2 93%   BMI 31.53 kg/m  Vital signs in last 24 hours: Temp:  [98.1 F (36.7 C)-98.2 F (36.8 C)] 98.1 F (36.7 C) (04/10 2248) Pulse Rate:  [77-86] 79 (04/10 2248) Resp:  [15-17] 15 (04/10 2248) BP: (126-143)/(80-100) 143/92 (04/10 2248) SpO2:  [93 %-97 %] 93 % (04/10 2248) Weight:  [108.4 kg] 108.4 kg (04/10 1418)   Physical Exam  Constitutional: Appears well-developed and well-nourished.  Psych: Affect appropriate to situation, calm, cooperative and appropriately mildly anxious Eyes: No scleral injection HENT: No oropharyngeal obstruction, good dentition.  No neck stiffness or limitation in range of motion MSK: no joint deformities.  Cardiovascular: Normal rate and regular rhythm.  Respiratory: Effort normal, non-labored breathing GI: Soft.  No distension. There is no tenderness.  Skin: Warm dry and intact visible skin, mild scratched areas of the left upper extremity without significant skin injury  Neuro: Mental  Status: Patient is awake, alert, oriented to person, place, month, year, and situation. Patient is able to give a clear and coherent history. No signs of aphasia or neglect Cranial Nerves: II: Visual Fields are full. Pupils are equal, round, and reactive to light.   III,IV, VI: EOMI without ptosis or diploplia.  V: Facial sensation is symmetric to temperature and light touch VII: Facial movement is notable for a slight left facial assymmtery  VIII: hearing is intact to voice X: Uvula elevates symmetrically XI: Shoulder shrug is symmetric. XII: tongue is midline without atrophy or fasciculations.  Motor: Bulk is normal. 5/5 strength thoughout EXCEPT 4/5 in the left wrist flexion, extension, finger extension and finger flexion, and 4+/5 left hip flexion Sensory: On the back, patient reports progressive decreased cold  sensation without clear spinal level going rostrally to caudally  Proprioception and vibration are absent in the left hand, but intact on the right  Right hand has a patch of numbness on the ulnar side of the hand as well as the 4th and 5th digits  Left foot has reduced vibratory sensation at the great toe (7 seconds vs 11 on the right toe)  Deep Tendon Reflexes: Absent left biceps, otherwise 2+ in the biceps, brachioradialis, patella and achilles bilaterally. + Hoffman's only on the left  Plantars: Toes are downgoing bilaterally.  Cerebellar: FNF and HKS are intact bilaterally Gait:  Able to rise on heels and toes but unsteady   I have reviewed labs in epic and the results pertinent to this consultation are: Mild leukocytosis to 13.5 at 1458 (prior to steroids at [facility] and increased from baseline of 8.3 on 4/3 on day 2 of IV methylprednisolone during initial hospitalization)  I have reviewed the images obtained: MRI brain, MRI cervical spine, MRI thoracic spine Overall size of lesion is stable although there is perhaps slightly more edema definitely more homogenous contrast enhancement than seen on initial scan  Impression: This is a 30 year old previously healthy man presenting with persistent/recurrent symptoms from spinal cord lesion after completing 5 days of pulse dose steroids.  This is concerning for potentially steroid resistant transverse myelitis versus underlying infection versus less likely malignancy.  Recommendations: -Lumbar puncture attempted at [facility] ED, will reach out to IR for assistance  -Cell count, protein, glucose, gram stain/culture, (ordered)  -VZV PCR and IgG, HSV1/2, EBV, enterovirus D68 and EV71, HTLV-1, Lyme, VDRL, (ordered)  -oligoclonal bands and IgG index (ordered)  -consider flash freezing 5 mL for advanced analysis at [facility] / [facility] center (stroed at ideally -80 or at minimum -20 C, to be sent only if patient continues to have refractory  course) -ANA, ds-DNA, Ro/SSA, La/SSB, Sm, RNP, antiphospholipid Ab panel (ordered) -Will arrange PLEX today   Brooke Dare MD-PhD [facility] [phone] Available 7 PM to 7 AM, outside of these hours please call Neurologist on call as listed on Amion.

## 2020-07-06 NOTE — Progress Notes (Signed)
Received pt from [facility]. No report called. Pt is A/Ox4 with no c/o pain. Pt oriented to room. Call bell and personal belongings within reach. Bed in low locked position. Pt needs addressed at this time. Will continue to monitor,

## 2020-07-06 NOTE — Plan of Care (Signed)
  Problem: Clinical Measurements: Goal: Will remain free from infection Outcome: Progressing   Problem: Activity: Goal: Risk for activity intolerance will decrease Outcome: Progressing   Problem: Safety: Goal: Ability to remain free from injury will improve Outcome: Progressing   

## 2020-07-06 NOTE — ED Provider Notes (Signed)
.  Lumbar Puncture  Date/Time: 07/06/2020 1:18 AM Performed by: Terald Sleeper, MD Authorized by: Terald Sleeper, MD   Consent:    Consent obtained:  Verbal   Consent given by:  Patient   Risks, benefits, and alternatives were discussed: yes     Risks discussed:  Headache, nerve damage, infection, repeat procedure, pain and bleeding   Alternatives discussed:  Delayed treatment Universal protocol:    Procedure explained and questions answered to patient or proxy's satisfaction: yes     Relevant documents present and verified: yes     Immediately prior to procedure a time out was called: yes     Site/side marked: yes     Patient identity confirmed:  Arm band and anonymous protocol, patient vented/unresponsive Pre-procedure details:    Procedure purpose:  Diagnostic   Preparation: Patient was prepped and draped in usual sterile fashion   Anesthesia:    Anesthesia method:  Local infiltration   Local anesthetic:  Lidocaine 1% w/o epi Procedure details:    Lumbar space:  L4-L5 interspace   Patient position:  Sitting   Needle gauge:  20   Number of attempts:  1 Post-procedure details:    Puncture site:  Adhesive bandage applied and direct pressure applied   Procedure completion:  Tolerated well, no immediate complications Comments:     Unsuccessful LP, unable to obtain CSF      Terald Sleeper, MD 07/06/20 [phone]

## 2020-07-07 LAB — SJOGRENS SYNDROME-B EXTRACTABLE NUCLEAR ANTIBODY: SSB (La) (ENA) Antibody, IgG: <0.2 AI (ref 0.0–0.9)

## 2020-07-07 LAB — CBC
HCT: 47.1 % (ref 39.0–52.0)
Hemoglobin: 16.3 g/dL (ref 13.0–17.0)
MCH: 31 pg (ref 26.0–34.0)
MCHC: 34.6 g/dL (ref 30.0–36.0)
MCV: 89.5 fL (ref 80.0–100.0)
Platelets: 279 10*3/uL (ref 150–400)
RBC: 5.26 MIL/uL (ref 4.22–5.81)
RDW: 13.8 % (ref 11.5–15.5)
WBC: 19.2 10*3/uL — ABNORMAL HIGH (ref 4.0–10.5)
nRBC: 0 % (ref 0.0–0.2)

## 2020-07-07 LAB — ANTIEXTRACTABLE NUCLEAR AG
ENA SM Ab Ser-aCnc: <0.2 AI (ref 0.0–0.9)
Ribonucleic Protein: 0.3 AI (ref 0.0–0.9)

## 2020-07-07 LAB — ANA W/REFLEX IF POSITIVE: Anti Nuclear Antibody (ANA): NEGATIVE

## 2020-07-07 LAB — ANTI-DNA ANTIBODY, DOUBLE-STRANDED: ds DNA Ab: <1 IU/mL (ref 0–9)

## 2020-07-07 LAB — BASIC METABOLIC PANEL
Anion gap: 7 (ref 5–15)
BUN: 17 mg/dL (ref 6–20)
CO2: 27 mmol/L (ref 22–32)
Calcium: 8.7 mg/dL — ABNORMAL LOW (ref 8.9–10.3)
Chloride: 103 mmol/L (ref 98–111)
Creatinine, Ser: 1.17 mg/dL (ref 0.61–1.24)
GFR, Estimated: >60 mL/min (ref >60)
Glucose, Bld: 121 mg/dL — ABNORMAL HIGH (ref 70–99)
Potassium: 3.7 mmol/L (ref 3.5–5.1)
Sodium: 137 mmol/L (ref 135–145)

## 2020-07-07 LAB — VARICELLA ZOSTER ANTIBODY, IGG: Varicella IgG: 219 index (ref >165)

## 2020-07-07 LAB — SJOGRENS SYNDROME-A EXTRACTABLE NUCLEAR ANTIBODY: SSA (Ro) (ENA) Antibody, IgG: <0.2 AI (ref 0.0–0.9)

## 2020-07-07 LAB — ANTI-SMITH ANTIBODY: ENA SM Ab Ser-aCnc: <0.2 AI (ref 0.0–0.9)

## 2020-07-07 MED ORDER — PREDNISONE 5 MG PO TABS
50.0000 mg | ORAL_TABLET | Freq: Once | ORAL | Status: AC
  Administered 2020-07-07: 50 mg via ORAL
  Filled 2020-07-07: qty 2

## 2020-07-07 MED ORDER — PREDNISONE 10 MG PO TABS: 10.0000 mg | ORAL_TABLET | Freq: Every day | ORAL | 0 refills | Status: AC

## 2020-07-07 MED ORDER — PREDNISONE 10 MG PO TABS: 10.0000 mg | ORAL_TABLET | Freq: Every day | ORAL | 0 refills | Status: DC

## 2020-07-07 MED ORDER — GABAPENTIN 300 MG PO CAPS: 300.0000 mg | ORAL_CAPSULE | Freq: Every day | ORAL | 0 refills | Status: AC

## 2020-07-07 NOTE — Progress Notes (Signed)
Hunter Bell to be D/C'd home per MD order. Discussed with the patient and all questions fully answered.  Skin clean, dry and intact without evidence of skin break down, no evidence of skin tears noted.  IV catheter discontinued intact. Site without signs and symptoms of complications. Dressing and pressure applied.  An After Visit Summary was printed and given to the patient.  Patient escorted via WC, and D/C home via private auto.  Jon Gills  07/07/2020

## 2020-07-07 NOTE — Discharge Instructions (Signed)
Advised to follow-up with primary care physician in 1 week. Advised to follow-up with Neurology in 2 to 4 weeks. Advised to take prednisone 40 mg for 1 day then 30 mg for 1 day then 20 mg for 1 day and then 10 mg for 1 day and then discontinue. Advised to take gabapentin 300 mg at bedtime.  We will request primary care physician to increase his needed and tolerated.  We will request primary care physician to adjust vitamin D and B12 supplements.

## 2020-07-07 NOTE — Discharge Summary (Signed)
Physician Discharge Summary  Tanmay Halteman ZOX:[id number] DOB: January 13, 1991 DOA: 07/05/2020  PCP: Patient, No Pcp Per (Inactive)  Admit date: 07/05/2020   Discharge date: 07/07/2020  Admitted From: Home.  Disposition:  Home.  Recommendations for Outpatient Follow-up:  1. Follow up with PCP in 1-2 weeks. 2. Please obtain BMP/CBC in one week. 3. Advised to follow-up with Neurology in 2 to 4 weeks. 4. Advised to take Prednisone 40 mg for 1 day then 30 mg for 1 day then 20 mg for 1 day and then 10 mg for 1 day and then discontinue. 5. Advised to take gabapentin 300 mg at bedtime.  We will request primary care physician to increase as needed and tolerated.  We will request primary care physician to adjust vitamin D and B12 supplements.  Home Health: None. Equipment/Devices: None  Discharge Condition: Stable CODE STATUS:Full code Diet recommendation: Heart Healthy   Brief Summary / Hospital course: This 30 years old male with recent diagnosis of demyelinating process in the cervical spine who was recently hospitalized at [facility] on 4/2, He was started on high-dose steroids.  Patient had some improvement in his symptoms,  he was sent home on high-dose prednisone for 2 days after stopping the medications his symptoms slowly returned.  Patient reported difficulty coordinating movement in his left hand with paresthesia and weakness, He also reported paresthesia in the left foot and lower leg, denies any triggering events.  Patient was admitted for neurological evaluation.  Neurology was consulted .  MRI C-spine T-spine completed which was unremarkable.  Patient underwent lumbar puncture by IR under fluoroscopy.  Labs were unremarkable.  Patient reported his left hand is feeling better in regards to being able to grasp and feel his fingers.  He still reports having numbness and tingling in his left hand thumb and index finger.  Neurology recommended, Patient is cleared and patient can be discharged home.   Patient feels better,  ambulated in the hallway without any symptoms and patient want to be discharged  Patient was managed for below problems during hospitalization.    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 Patient was admitted for neurological evaluation. Patient was started on high-dose steroids. MRI brain MRI C-spine and thoracic spine shows overall size of the lesion which is stable. Neurology consulted recommended lumbar puncture. Patient underwent lumbar puncture labs pending. Patient does not require plasmapheresis. Neurology recommended short course of steroids for 5 days. Add gabapentin for paresthesia. Patient feels better and patient is being discharged on tapering dose of steroids.  Patient will follow up with neurology.  Some CSF labs will be followed by a neurologist.  Discharge Instructions  Discharge Instructions    Call MD for:  difficulty breathing, headache or visual disturbances   Complete by: As directed    Call MD for:  persistant dizziness or light-headedness   Complete by: As directed    Call MD for:  persistant nausea and vomiting   Complete by: As directed    Diet - low sodium heart healthy   Complete by: As directed    Diet Carb Modified   Complete by: As directed    Discharge instructions   Complete by: As directed    Advised to follow-up with primary care physician in 1 week. Advised to follow-up with Neurology in 2 to 4 weeks. Advised to take prednisone 40 mg for  1 day then 30 mg for 1 day then 20 mg for 1 day and then 10 mg for 1 day and then discontinue. Advised to take gabapentin 300 mg at bedtime.  We will request primary care physician to increase his needed and tolerated.  We will request primary care physician to adjust vitamin D and B12 supplements.   Increase  activity slowly   Complete by: As directed      Allergies as of 07/07/2020   No Known Allergies     Medication List    TAKE these medications   gabapentin 300 MG capsule Commonly known as: Neurontin Take 1 capsule (300 mg total) by mouth at bedtime.   pantoprazole 40 MG tablet Commonly known as: PROTONIX Take 1 tablet (40 mg total) by mouth daily.   predniSONE 10 MG tablet Commonly known as: DELTASONE Take 1 tablet (10 mg total) by mouth daily. Advised to take 50 mg of prednisone for 1 day, then prednisone 40 mg for 1 day then prednisone 30 mg for 1 day then prednisone20 mg for 1 day then prednisone 10 mg for 1 day and then discontinue. What changed:   medication strength  how much to take  when to take this  additional instructions   vitamin B-12 1000 MCG tablet Commonly known as: CYANOCOBALAMIN Take 1 tablet (1,000 mcg total) by mouth daily.   Vitamin D (Ergocalciferol) 1.25 MG (50000 UNIT) Caps capsule Commonly known as: DRISDOL Take 1 capsule (50,000 Units total) by mouth every 7 (seven) days.       Follow-up Information    Erick Blinks, MD Follow up in 2 week(s).   Specialty: Neurology Contact information: [address] [place] [address] [phone]              No Known Allergies  Consultations:  Neurology   Procedures/Studies: CT Head Wo Contrast  Result Date: 06/25/2020 CLINICAL DATA:  New onset left-sided numbness EXAM: CT HEAD WITHOUT CONTRAST TECHNIQUE: Contiguous axial images were obtained from the base of the skull through the vertex without intravenous contrast. COMPARISON:  None. FINDINGS: Brain: There is no mass, hemorrhage or extra-axial collection. The size and configuration of the ventricles and extra-axial CSF spaces are normal. The brain parenchyma is normal, without acute or chronic infarction. Vascular: No abnormal hyperdensity of the major intracranial arteries or dural venous sinuses. No intracranial  atherosclerosis. Skull: The visualized skull base, calvarium and extracranial soft tissues are normal. Sinuses/Orbits: No fluid levels or advanced mucosal thickening of the visualized paranasal sinuses. No mastoid or middle ear effusion. The orbits are normal. IMPRESSION: Normal head CT. Electronically Signed   By: Deatra Robinson M.D.   On: 06/25/2020 03:13   MR BRAIN W WO CONTRAST  Result Date: 07/06/2020 CLINICAL DATA:  Demyelinating disease EXAM: MRI HEAD WITHOUT AND WITH CONTRAST MRI CERVICAL SPINE WITHOUT AND WITH CONTRAST MRI THORACIC SPINE WITHOUT AND WITH CONTRAST TECHNIQUE: Multiplanar, multiecho pulse sequences of the brain and surrounding structures, cervical spine and thoracic spine were obtained without and with intravenous contrast. CONTRAST:  27mL GADAVIST GADOBUTROL 1 MMOL/ML IV SOLN COMPARISON:  06/27/2020 FINDINGS: MRI HEAD FINDINGS Brain: No acute infarct, mass effect or extra-axial collection. No acute or chronic hemorrhage. Normal white matter signal, parenchymal volume and CSF spaces. The midline structures are normal. Vascular: Major flow voids are preserved. Skull and upper cervical spine: Normal calvarium and skull base. Visualized upper cervical spine and soft tissues are normal. Sinuses/Orbits:No paranasal sinus fluid levels or advanced mucosal  thickening. No mastoid or middle ear effusion. Normal orbits. MRI CERVICAL SPINE FINDINGS Alignment: Normal Vertebrae: Normal Cord: Unchanged size of hyperintense T2-weighted signal lesion at the C4-5 level. There is more distinct contrast enhancement on the current study, which may partially be due to technical factors. Posterior Fossa, vertebral arteries, paraspinal tissues: Negative Disc levels: There is no disc herniation, spinal canal stenosis or neural impingement. MRI THORACIC SPINE FINDINGS Alignment: Physiologic. Vertebrae: No fracture, evidence of discitis, or bone lesion. Cord: Normal signal and morphology. Paraspinal soft tissues:  Negative. Disc levels: There is a small central disc protrusion at T7-8 without spinal canal stenosis. IMPRESSION: 1. Normal MRI of the brain. 2. Unchanged size of hyperintense T2-weighted signal lesion at the C4-5 level with more distinct contrast enhancement, again favored to represent demyelinating disease. Electronically Signed   By: Deatra RobinsonKevin  Herman M.D.   On: 07/06/2020 02:53   MR BRAIN W WO CONTRAST  Result Date: 06/27/2020 CLINICAL DATA:  Multiple sclerosis suspected. EXAM: MRI HEAD WITHOUT AND WITH CONTRAST TECHNIQUE: Multiplanar, multiecho pulse sequences of the brain and surrounding structures were obtained without and with intravenous contrast. CONTRAST:  10mL GADAVIST GADOBUTROL 1 MMOL/ML IV SOLN COMPARISON:  None. FINDINGS: Brain: No acute infarction, hemorrhage, hydrocephalus, extra-axial collection or mass lesion. No visible white matter lesions. No abnormal enhancement or restricted diffusion. Vascular: Major arterial flow voids are maintained at the skull base. Skull and upper cervical spine: Normal marrow signal. Please see concurrent MRI of the cervical spine for evaluation of a cord lesion. Sinuses/Orbits: Mild inferior maxillary sinuses and ethmoid air cell mucosal thickening. Other: No mastoid effusions. IMPRESSION: 1. No acute intracranial abnormality. No visible white matter lesions or abnormal enhancement. 2. Please see concurrent MRI of the cervical spine for evaluation of a cord lesion. Electronically Signed   By: Feliberto HartsFrederick S Jones MD   On: 06/27/2020 16:12   MR CERVICAL SPINE W WO CONTRAST  Result Date: 07/06/2020 CLINICAL DATA:  Demyelinating disease EXAM: MRI HEAD WITHOUT AND WITH CONTRAST MRI CERVICAL SPINE WITHOUT AND WITH CONTRAST MRI THORACIC SPINE WITHOUT AND WITH CONTRAST TECHNIQUE: Multiplanar, multiecho pulse sequences of the brain and surrounding structures, cervical spine and thoracic spine were obtained without and with intravenous contrast. CONTRAST:  10mL GADAVIST  GADOBUTROL 1 MMOL/ML IV SOLN COMPARISON:  06/27/2020 FINDINGS: MRI HEAD FINDINGS Brain: No acute infarct, mass effect or extra-axial collection. No acute or chronic hemorrhage. Normal white matter signal, parenchymal volume and CSF spaces. The midline structures are normal. Vascular: Major flow voids are preserved. Skull and upper cervical spine: Normal calvarium and skull base. Visualized upper cervical spine and soft tissues are normal. Sinuses/Orbits:No paranasal sinus fluid levels or advanced mucosal thickening. No mastoid or middle ear effusion. Normal orbits. MRI CERVICAL SPINE FINDINGS Alignment: Normal Vertebrae: Normal Cord: Unchanged size of hyperintense T2-weighted signal lesion at the C4-5 level. There is more distinct contrast enhancement on the current study, which may partially be due to technical factors. Posterior Fossa, vertebral arteries, paraspinal tissues: Negative Disc levels: There is no disc herniation, spinal canal stenosis or neural impingement. MRI THORACIC SPINE FINDINGS Alignment: Physiologic. Vertebrae: No fracture, evidence of discitis, or bone lesion. Cord: Normal signal and morphology. Paraspinal soft tissues: Negative. Disc levels: There is a small central disc protrusion at T7-8 without spinal canal stenosis. IMPRESSION: 1. Normal MRI of the brain. 2. Unchanged size of hyperintense T2-weighted signal lesion at the C4-5 level with more distinct contrast enhancement, again favored to represent demyelinating disease. Electronically Signed   By: Caryn BeeKevin  Chase Picket M.D.   On: 07/06/2020 02:53   MR CERVICAL SPINE W WO CONTRAST  Result Date: 06/27/2020 CLINICAL DATA:  Suspicion for multiple sclerosis. Check for lesions. EXAM: MRI CERVICAL SPINE WITHOUT AND WITH CONTRAST TECHNIQUE: Multiplanar and multiecho pulse sequences of the cervical spine, to include the craniocervical junction and cervicothoracic junction, were obtained without and with intravenous contrast. CONTRAST:  21mL GADAVIST  GADOBUTROL 1 MMOL/ML IV SOLN COMPARISON:  None. FINDINGS: Motion limited evaluation. Alignment: Straightening of the normal cervical lordosis. No substantial sagittal subluxation. Vertebrae: Vertebral body heights are maintained. No specific evidence of acute fracture, discitis/osteomyelitis, or suspicious bone lesion. Cord: At C4-C5 there is a short segment area of T2/stir hyperintensity within the midline dorsal cord which demonstrates irregular enhancement. This area is mildly expanded. Posterior Fossa, vertebral arteries, paraspinal tissues: Posterior fossa is further characterized on concurrent MRI head. Vertebral artery flow voids are maintained. Disc levels: C2-C3: Mild uncovertebral hypertrophy without evidence of significant canal or foraminal stenosis. C3-C4: Motion limited evaluation no significant canal stenosis. Bilateral uncovertebral hypertrophy with possible mild bilateral foraminal stenosis. C4-C5: Motion limited evaluation. Bilateral uncovertebral hypertrophy without significant canal stenosis. Possible mild left foraminal stenosis. C5-C6: No significant disc protrusion, foraminal stenosis, or canal stenosis. C6-C7: No significant disc protrusion, foraminal stenosis, or canal stenosis. C7-T1: No significant disc protrusion, foraminal stenosis, or canal stenosis. IMPRESSION: 1. Mildly expansile short-segment T2/STIR hyperintensity within the dorsal cord at C4-C5 with irregular enhancement. Findings are most consistent with active demyelination given the characteristic appearance and the provided clinical history. Infection, transverse myelitis, or tumor are less likely differential considerations. 2. Motion limited evaluation with possibly mild foraminal stenosis bilaterally at C3-C4 and on the left at C4-C5. No significant canal stenosis. Repeat study (possibly with sedation) could better characterize the foramina if clinically indicated. Electronically Signed   By: Feliberto Harts MD   On:  06/27/2020 15:50   MR THORACIC SPINE W WO CONTRAST  Result Date: 07/06/2020 CLINICAL DATA:  Demyelinating disease EXAM: MRI HEAD WITHOUT AND WITH CONTRAST MRI CERVICAL SPINE WITHOUT AND WITH CONTRAST MRI THORACIC SPINE WITHOUT AND WITH CONTRAST TECHNIQUE: Multiplanar, multiecho pulse sequences of the brain and surrounding structures, cervical spine and thoracic spine were obtained without and with intravenous contrast. CONTRAST:  24mL GADAVIST GADOBUTROL 1 MMOL/ML IV SOLN COMPARISON:  06/27/2020 FINDINGS: MRI HEAD FINDINGS Brain: No acute infarct, mass effect or extra-axial collection. No acute or chronic hemorrhage. Normal white matter signal, parenchymal volume and CSF spaces. The midline structures are normal. Vascular: Major flow voids are preserved. Skull and upper cervical spine: Normal calvarium and skull base. Visualized upper cervical spine and soft tissues are normal. Sinuses/Orbits:No paranasal sinus fluid levels or advanced mucosal thickening. No mastoid or middle ear effusion. Normal orbits. MRI CERVICAL SPINE FINDINGS Alignment: Normal Vertebrae: Normal Cord: Unchanged size of hyperintense T2-weighted signal lesion at the C4-5 level. There is more distinct contrast enhancement on the current study, which may partially be due to technical factors. Posterior Fossa, vertebral arteries, paraspinal tissues: Negative Disc levels: There is no disc herniation, spinal canal stenosis or neural impingement. MRI THORACIC SPINE FINDINGS Alignment: Physiologic. Vertebrae: No fracture, evidence of discitis, or bone lesion. Cord: Normal signal and morphology. Paraspinal soft tissues: Negative. Disc levels: There is a small central disc protrusion at T7-8 without spinal canal stenosis. IMPRESSION: 1. Normal MRI of the brain. 2. Unchanged size of hyperintense T2-weighted signal lesion at the C4-5 level with more distinct contrast enhancement, again favored to represent demyelinating disease. Electronically Signed  By: Deatra Robinson M.D.   On: 07/06/2020 02:53   DG FL GUIDED LUMBAR PUNCTURE  Result Date: 07/06/2020 CLINICAL DATA:  Demyelinating disease. EXAM: DIAGNOSTIC LUMBAR PUNCTURE UNDER FLUOROSCOPIC GUIDANCE COMPARISON:  None. FLUOROSCOPY TIME:  Fluoroscopy Time:  0 minutes 24 seconds Radiation Exposure Index (if provided by the fluoroscopic device): Number of Acquired Spot Images: 0 PROCEDURE: Informed consent was obtained from the patient prior to the procedure, including potential complications of headache, allergy, and pain. With the patient prone, the lower back was prepped with Betadine. 1% Lidocaine was used for local anesthesia. Lumbar puncture was performed at the L4-5 level using a 20 gauge needle with return of clear CSF with an opening pressure of 12 cm water. 9 ml of CSF were obtained for laboratory studies. The patient tolerated the procedure well and there were no apparent complications. IMPRESSION: Successful lumbar puncture under fluoroscopy. Electronically Signed   By: Leanna Battles M.D.   On: 07/06/2020 10:45      Subjective: Patient was seen and examined at bedside.  Overnight events noted.  Patient reports improvement in his symptoms.  Patient feels better and want to be discharged.  Discharge Exam: Vitals:   07/06/20 2121 07/07/20 0500  BP: 115/71 130/70  Pulse: 88 83  Resp: 18 18  Temp: 98.7 F (37.1 C) 98.7 F (37.1 C)  SpO2: 92% 95%   Vitals:   07/06/20 0245 07/06/20 1423 07/06/20 2121 07/07/20 0500  BP: 131/85 (!) 155/78 115/71 130/70  Pulse: (P) 84 100 88 83  Resp: Temp: (P) 98.2 F (36.8 C) 98.7 F (37.1 C) 98.7 F (37.1 C) 98.7 F (37.1 C)  TempSrc: (P) Oral Oral Oral Oral  SpO2: 91%  92% 95%  Weight:      Height:        General: Pt is alert, awake, not in acute distress Cardiovascular: RRR, S1/S2 +, no rubs, no gallops Respiratory: CTA bilaterally, no wheezing, no rhonchi Abdominal: Soft, NT, ND, bowel sounds + Extremities: no edema,  no cyanosis    The results of significant diagnostics from this hospitalization (including imaging, microbiology, ancillary and laboratory) are listed below for reference.     Microbiology: Recent Results (from the past 240 hour(s))  Resp Panel by RT-PCR (Flu A&B, Covid) Nasopharyngeal Swab     Status: None   Collection Time: 06/27/20  4:59 PM   Specimen: Nasopharyngeal Swab; Nasopharyngeal(NP) swabs in vial transport medium  Result Value Ref Range Status   SARS Coronavirus 2 by RT PCR NEGATIVE NEGATIVE Final    Comment: (NOTE) SARS-CoV-2 target nucleic acids are NOT DETECTED.  The SARS-CoV-2 RNA is generally detectable in upper respiratory specimens during the acute phase of infection. The lowest concentration of SARS-CoV-2 viral copies this assay can detect is 138 copies/mL. A negative result does not preclude SARS-Cov-2 infection and should not be used as the sole basis for treatment or other patient management decisions. A negative result may occur with  improper specimen collection/handling, submission of specimen other than nasopharyngeal swab, presence of viral mutation(s) within the areas targeted by this assay, and inadequate number of viral copies(<138 copies/mL). A negative result must be combined with clinical observations, patient history, and epidemiological information. The expected result is Negative.  Fact Sheet for Patients:  BloggerCourse.com  Fact Sheet for Healthcare Providers:  SeriousBroker.it  This test is no t yet approved or cleared by the [place] FDA and  has been authorized for detection and/or diagnosis of SARS-CoV-2  by FDA under an Emergency Use Authorization (EUA). This EUA will remain  in effect (meaning this test can be used) for the duration of the COVID-19 declaration under Section 564(b)(1) of the Act, 21 U.S.C.section 360bbb-3(b)(1), unless the authorization is terminated  or revoked  sooner.       Influenza A by PCR NEGATIVE NEGATIVE Final   Influenza B by PCR NEGATIVE NEGATIVE Final    Comment: (NOTE) The Xpert Xpress SARS-CoV-2/FLU/RSV plus assay is intended as an aid in the diagnosis of influenza from Nasopharyngeal swab specimens and should not be used as a sole basis for treatment. Nasal washings and aspirates are unacceptable for Xpert Xpress SARS-CoV-2/FLU/RSV testing.  Fact Sheet for Patients: BloggerCourse.com  Fact Sheet for Healthcare Providers: SeriousBroker.it  This test is not yet approved or cleared by the [place] FDA and has been authorized for detection and/or diagnosis of SARS-CoV-2 by FDA under an Emergency Use Authorization (EUA). This EUA will remain in effect (meaning this test can be used) for the duration of the COVID-19 declaration under Section 564(b)(1) of the Act, 21 U.S.C. section 360bbb-3(b)(1), unless the authorization is terminated or revoked.  Performed at [facility], [address] [place], [place] [address]   Resp Panel by RT-PCR (Flu A&B, Covid) Nasopharyngeal Swab     Status: None   Collection Time: 07/05/20  3:57 PM   Specimen: Nasopharyngeal Swab; Nasopharyngeal(NP) swabs in vial transport medium  Result Value Ref Range Status   SARS Coronavirus 2 by RT PCR NEGATIVE NEGATIVE Final    Comment: (NOTE) SARS-CoV-2 target nucleic acids are NOT DETECTED.  The SARS-CoV-2 RNA is generally detectable in upper respiratory specimens during the acute phase of infection. The lowest concentration of SARS-CoV-2 viral copies this assay can detect is 138 copies/mL. A negative result does not preclude SARS-Cov-2 infection and should not be used as the sole basis for treatment or other patient management decisions. A negative result may occur with  improper specimen collection/handling, submission of specimen other than nasopharyngeal swab, presence of viral  mutation(s) within the areas targeted by this assay, and inadequate number of viral copies(<138 copies/mL). A negative result must be combined with clinical observations, patient history, and epidemiological information. The expected result is Negative.  Fact Sheet for Patients:  BloggerCourse.com  Fact Sheet for Healthcare Providers:  SeriousBroker.it  This test is no t yet approved or cleared by the [place] FDA and  has been authorized for detection and/or diagnosis of SARS-CoV-2 by FDA under an Emergency Use Authorization (EUA). This EUA will remain  in effect (meaning this test can be used) for the duration of the COVID-19 declaration under Section 564(b)(1) of the Act, 21 U.S.C.section 360bbb-3(b)(1), unless the authorization is terminated  or revoked sooner.       Influenza A by PCR NEGATIVE NEGATIVE Final   Influenza B by PCR NEGATIVE NEGATIVE Final    Comment: (NOTE) The Xpert Xpress SARS-CoV-2/FLU/RSV plus assay is intended as an aid in the diagnosis of influenza from Nasopharyngeal swab specimens and should not be used as a sole basis for treatment. Nasal washings and aspirates are unacceptable for Xpert Xpress SARS-CoV-2/FLU/RSV testing.  Fact Sheet for Patients: BloggerCourse.com  Fact Sheet for Healthcare Providers: SeriousBroker.it  This test is not yet approved or cleared by the [place] FDA and has been authorized for detection and/or diagnosis of SARS-CoV-2 by FDA under an Emergency Use Authorization (EUA). This EUA will remain in effect (meaning this test can be used) for  the duration of the COVID-19 declaration under Section 564(b)(1) of the Act, 21 U.S.C. section 360bbb-3(b)(1), unless the authorization is terminated or revoked.  Performed at [facility], [address], [place], [place] [address]   CSF culture w Gram Stain     Status: None  (Preliminary result)   Collection Time: 07/06/20 10:12 AM   Specimen: PATH Cytology CSF; Cerebrospinal Fluid  Result Value Ref Range Status   Specimen Description CSF  Final   Special Requests NONE  Final   Gram Stain NO WBC SEEN NO ORGANISMS SEEN   Final   Culture   Final    NO GROWTH < 24 HOURS Performed at [facility], [address], [place], [place] [address]    Report Status PENDING  Incomplete     Labs: BNP (last 3 results) No results for input(s): BNP in the last 8760 hours. Basic Metabolic Panel: Recent Labs  Lab 07/05/20 1458 07/07/20 0143  NA 137 137  K 3.5 3.7  CL 100 103  CO2 26 27  GLUCOSE 104* 121*  BUN 17 17  CREATININE 1.20 1.17  CALCIUM 8.7* 8.7*   Liver Function Tests: Recent Labs  Lab 07/05/20 1458  AST 22  ALT 53*  ALKPHOS 63  BILITOT 0.7  PROT 7.1  ALBUMIN 3.9   No results for input(s): LIPASE, AMYLASE in the last 168 hours. No results for input(s): AMMONIA in the last 168 hours. CBC: Recent Labs  Lab 07/05/20 1458 07/07/20 0143  WBC 13.5* 19.2*  NEUTROABS 8.3*  --   HGB 17.3* 16.3  HCT 50.5 47.1  MCV 89.4 89.5  PLT 278 279   Cardiac Enzymes: No results for input(s): CKTOTAL, CKMB, CKMBINDEX, TROPONINI in the last 168 hours. BNP: Invalid input(s): POCBNP CBG: No results for input(s): GLUCAP in the last 168 hours. D-Dimer No results for input(s): DDIMER in the last 72 hours. Hgb A1c No results for input(s): HGBA1C in the last 72 hours. Lipid Profile No results for input(s): CHOL, HDL, LDLCALC, TRIG, CHOLHDL, LDLDIRECT in the last 72 hours. Thyroid function studies No results for input(s): TSH, T4TOTAL, T3FREE, THYROIDAB in the last 72 hours.  Invalid input(s): FREET3 Anemia work up No results for input(s): VITAMINB12, FOLATE, FERRITIN, TIBC, IRON, RETICCTPCT in the last 72 hours. Urinalysis No results found for: COLORURINE, APPEARANCEUR, LABSPEC, PHURINE, GLUCOSEU, HGBUR, BILIRUBINUR, KETONESUR, PROTEINUR,  UROBILINOGEN, NITRITE, LEUKOCYTESUR Sepsis Labs Invalid input(s): PROCALCITONIN,  WBC,  LACTICIDVEN Microbiology Recent Results (from the past 240 hour(s))  Resp Panel by RT-PCR (Flu A&B, Covid) Nasopharyngeal Swab     Status: None   Collection Time: 06/27/20  4:59 PM   Specimen: Nasopharyngeal Swab; Nasopharyngeal(NP) swabs in vial transport medium  Result Value Ref Range Status   SARS Coronavirus 2 by RT PCR NEGATIVE NEGATIVE Final    Comment: (NOTE) SARS-CoV-2 target nucleic acids are NOT DETECTED.  The SARS-CoV-2 RNA is generally detectable in upper respiratory specimens during the acute phase of infection. The lowest concentration of SARS-CoV-2 viral copies this assay can detect is 138 copies/mL. A negative result does not preclude SARS-Cov-2 infection and should not be used as the sole basis for treatment or other patient management decisions. A negative result may occur with  improper specimen collection/handling, submission of specimen other than nasopharyngeal swab, presence of viral mutation(s) within the areas targeted by this assay, and inadequate number of viral copies(<138 copies/mL). A negative result must be combined with clinical observations, patient history, and epidemiological information. The expected result is  Negative.  Fact Sheet for Patients:  BloggerCourse.com  Fact Sheet for Healthcare Providers:  SeriousBroker.it  This test is no t yet approved or cleared by the [place] FDA and  has been authorized for detection and/or diagnosis of SARS-CoV-2 by FDA under an Emergency Use Authorization (EUA). This EUA will remain  in effect (meaning this test can be used) for the duration of the COVID-19 declaration under Section 564(b)(1) of the Act, 21 U.S.C.section 360bbb-3(b)(1), unless the authorization is terminated  or revoked sooner.       Influenza A by PCR NEGATIVE NEGATIVE Final   Influenza B by  PCR NEGATIVE NEGATIVE Final    Comment: (NOTE) The Xpert Xpress SARS-CoV-2/FLU/RSV plus assay is intended as an aid in the diagnosis of influenza from Nasopharyngeal swab specimens and should not be used as a sole basis for treatment. Nasal washings and aspirates are unacceptable for Xpert Xpress SARS-CoV-2/FLU/RSV testing.  Fact Sheet for Patients: BloggerCourse.com  Fact Sheet for Healthcare Providers: SeriousBroker.it  This test is not yet approved or cleared by the [place] FDA and has been authorized for detection and/or diagnosis of SARS-CoV-2 by FDA under an Emergency Use Authorization (EUA). This EUA will remain in effect (meaning this test can be used) for the duration of the COVID-19 declaration under Section 564(b)(1) of the Act, 21 U.S.C. section 360bbb-3(b)(1), unless the authorization is terminated or revoked.  Performed at [facility], [address] [place], [place] [address]   Resp Panel by RT-PCR (Flu A&B, Covid) Nasopharyngeal Swab     Status: None   Collection Time: 07/05/20  3:57 PM   Specimen: Nasopharyngeal Swab; Nasopharyngeal(NP) swabs in vial transport medium  Result Value Ref Range Status   SARS Coronavirus 2 by RT PCR NEGATIVE NEGATIVE Final    Comment: (NOTE) SARS-CoV-2 target nucleic acids are NOT DETECTED.  The SARS-CoV-2 RNA is generally detectable in upper respiratory specimens during the acute phase of infection. The lowest concentration of SARS-CoV-2 viral copies this assay can detect is 138 copies/mL. A negative result does not preclude SARS-Cov-2 infection and should not be used as the sole basis for treatment or other patient management decisions. A negative result may occur with  improper specimen collection/handling, submission of specimen other than nasopharyngeal swab, presence of viral mutation(s) within the areas targeted by this assay, and inadequate number of  viral copies(<138 copies/mL). A negative result must be combined with clinical observations, patient history, and epidemiological information. The expected result is Negative.  Fact Sheet for Patients:  BloggerCourse.com  Fact Sheet for Healthcare Providers:  SeriousBroker.it  This test is no t yet approved or cleared by the [place] FDA and  has been authorized for detection and/or diagnosis of SARS-CoV-2 by FDA under an Emergency Use Authorization (EUA). This EUA will remain  in effect (meaning this test can be used) for the duration of the COVID-19 declaration under Section 564(b)(1) of the Act, 21 U.S.C.section 360bbb-3(b)(1), unless the authorization is terminated  or revoked sooner.       Influenza A by PCR NEGATIVE NEGATIVE Final   Influenza B by PCR NEGATIVE NEGATIVE Final    Comment: (NOTE) The Xpert Xpress SARS-CoV-2/FLU/RSV plus assay is intended as an aid in the diagnosis of influenza from Nasopharyngeal swab specimens and should not be used as a sole basis for treatment. Nasal washings and aspirates are unacceptable for Xpert Xpress SARS-CoV-2/FLU/RSV testing.  Fact Sheet for Patients: BloggerCourse.com  Fact Sheet for Healthcare Providers: SeriousBroker.it  This test  is not yet approved or cleared by the [place] and has been authorized for detection and/or diagnosis of SARS-CoV-2 by FDA under an Emergency Use Authorization (EUA). This EUA will remain in effect (meaning this test can be used) for the duration of the COVID-19 declaration under Section 564(b)(1) of the Act, 21 U.S.C. section 360bbb-3(b)(1), unless the authorization is terminated or revoked.  Performed at [facility], [address] [place], [place] [address]   CSF culture w Gram Stain     Status: None (Preliminary result)   Collection Time: 07/06/20 10:12 AM   Specimen: PATH  Cytology CSF; Cerebrospinal Fluid  Result Value Ref Range Status   Specimen Description CSF  Final   Special Requests NONE  Final   Gram Stain NO WBC SEEN NO ORGANISMS SEEN   Final   Culture   Final    NO GROWTH < 24 HOURS Performed at [facility], [address] [place], [place] [address]    Report Status PENDING  Incomplete     Time coordinating discharge: Over 30 minutes  SIGNED:   Cipriano Bunker, MD  [facility] 07/07/2020, 11:41 AM Pager   If 7PM-7AM, please contact night-coverage [email or web address]

## 2020-07-08 LAB — EPSTEIN BARR VRS(EBV DNA BY PCR): EBV DNA QN by PCR: NEGATIVE IU/mL

## 2020-07-08 LAB — VDRL, CSF: VDRL Quant, CSF: NONREACTIVE

## 2020-07-08 LAB — HSV 1/2 PCR, CSF
HSV-1 DNA: NEGATIVE
HSV-2 DNA: NEGATIVE

## 2020-07-09 LAB — MISC LABCORP TEST (SEND OUT): Labcorp test code: 139835

## 2020-07-09 LAB — ANTIPHOSPHOLIPID SYNDROME EVAL, BLD
Anticardiolipin IgA: 15 APL U/mL — ABNORMAL HIGH (ref 0–11)
Anticardiolipin IgG: <9 GPL U/mL (ref 0–14)
Anticardiolipin IgM: 17 MPL U/mL — ABNORMAL HIGH (ref 0–12)
DRVVT: 35.3 s (ref 0.0–47.0)
PTT Lupus Anticoagulant: 30.8 s (ref 0.0–51.9)
Phosphatydalserine, IgA: 2 APS Units (ref 0–19)
Phosphatydalserine, IgG: 9 Units (ref 0–30)
Phosphatydalserine, IgM: 27 Units (ref 0–30)

## 2020-07-09 LAB — CSF CULTURE W GRAM STAIN
Culture: NO GROWTH
Gram Stain: NONE SEEN

## 2020-07-09 LAB — LYME DISEASE DNA BY PCR(BORRELIA BURG): Lyme Disease(B.burgdorferi)PCR: NEGATIVE

## 2020-07-09 LAB — VARICELLA-ZOSTER BY PCR

## 2020-07-09 LAB — OLIGOCLONAL BANDS, CSF + SERM

## 2020-07-10 LAB — ENTEROVIRUS PCR: Enterovirus PCR: NEGATIVE

## 2020-07-10 LAB — ANTI-MYELIN ASSOC GLYCOP IGG: Anti-Myelin Assoc Glycop IgG: <1:10 {titer}

## 2020-07-11 LAB — MISC LABCORP TEST (SEND OUT): Labcorp test code: 9985

## 2020-08-05 LAB — FUNGUS CULTURE WITH STAIN

## 2020-08-05 LAB — FUNGAL ORGANISM REFLEX

## 2020-08-05 LAB — FUNGUS CULTURE RESULT

## 2020-12-03 ENCOUNTER — Other Ambulatory Visit: Payer: Self-pay

## 2021-10-15 ENCOUNTER — Emergency Department (HOSPITAL_COMMUNITY)
Admission: EM | Admit: 2021-10-15 | Discharge: 2021-10-15 | Disposition: A | Discharge status: Home / Self Care | Payer: Self-pay | Attending: Emergency Medicine | Admitting: Emergency Medicine

## 2021-10-15 ENCOUNTER — Emergency Department (HOSPITAL_COMMUNITY): Payer: Self-pay

## 2021-10-15 ENCOUNTER — Encounter (HOSPITAL_COMMUNITY): Payer: Self-pay | Admitting: Emergency Medicine

## 2021-10-15 DIAGNOSIS — R0981 Nasal congestion: Secondary | ICD-10-CM | POA: Insufficient documentation

## 2021-10-15 DIAGNOSIS — J209 Acute bronchitis, unspecified: Secondary | ICD-10-CM

## 2021-10-15 DIAGNOSIS — R059 Cough, unspecified: Secondary | ICD-10-CM | POA: Insufficient documentation

## 2021-10-15 MED ORDER — PREDNISONE 50 MG PO TABS
60.0000 mg | ORAL_TABLET | Freq: Once | ORAL | Status: AC
  Administered 2021-10-15: 60 mg via ORAL
  Filled 2021-10-15: qty 1

## 2021-10-15 MED ORDER — ALBUTEROL SULFATE HFA 108 (90 BASE) MCG/ACT IN AERS
2.0000 | INHALATION_SPRAY | Freq: Once | RESPIRATORY_TRACT | Status: AC
  Administered 2021-10-15: 2 via RESPIRATORY_TRACT
  Filled 2021-10-15: qty 6.7

## 2021-10-15 MED ORDER — BENZONATATE 100 MG PO CAPS: 100.0000 mg | ORAL_CAPSULE | Freq: Three times a day (TID) | ORAL | 0 refills | Status: AC

## 2021-10-15 MED ORDER — METHYLPREDNISOLONE 4 MG PO TBPK: ORAL_TABLET | ORAL | 0 refills | Status: AC

## 2021-10-15 NOTE — Discharge Instructions (Addendum)
Take 1 to 2 puffs of albuterol inhaler every 4 hours as needed for coughing wheezing. Take the medications I have prescribed to you for coughing and to decrease the inflammatory spots in your lungs.  Your chest x-ray shows no evidence of infection.  Avoid smoke, significant heat, and poor air quality to decrease the inflammatory response in your lungs.

## 2021-10-15 NOTE — ED Triage Notes (Signed)
Cough, irritation to throat and congestion for over a week. Family members have been sick with the same s/s.

## 2021-10-15 NOTE — ED Provider Notes (Signed)
[facility] EMERGENCY DEPARTMENT Provider Note   CSN: [id number] Arrival date & time: 10/15/21  1842     History  Chief Complaint  Patient presents with   Cough    Hunter Bell is a 31 y.o. male.  31 year old male who presents emergency department productive cough.  Patient states that he and his family members got sick last weekend however everyone has gotten better.  He has productive cough, nasal congestion, hoarse voice.  Patient states that he is coughing to the point of gagging and breathlessness.  He is never had anything like this before.  It is worse at night.  He is tried cough medicine without relief.  No fevers.    Cough      Home Medications Prior to Admission medications   Medication Sig Start Date End Date Taking? Authorizing Provider  cyanocobalamin 1000 MCG tablet Take 1 tablet (1,000 mcg total) by mouth daily. 06/30/20   Remo Lipps, MD  gabapentin (NEURONTIN) 300 MG capsule Take 1 capsule (300 mg total) by mouth at bedtime. 07/07/20 07/07/21  Cipriano Bunker, MD  pantoprazole (PROTONIX) 40 MG tablet Take 1 tablet (40 mg total) by mouth daily. 06/29/20   Remo Lipps, MD  predniSONE (DELTASONE) 10 MG tablet Take 1 tablet (10 mg total) by mouth daily. Advised to take 50 mg of prednisone for 1 day, then prednisone 40 mg for 1 day then prednisone 30 mg for 1 day then prednisone20 mg for 1 day then prednisone 10 mg for 1 day and then discontinue. 07/07/20   Cipriano Bunker, MD  Vitamin D, Ergocalciferol, (DRISDOL) 1.25 MG (50000 UNIT) CAPS capsule Take 1 capsule (50,000 Units total) by mouth every 7 (seven) days. 06/29/20   Remo Lipps, MD      Allergies    Patient has no known allergies.    Review of Systems   Review of Systems  Respiratory:  Positive for cough.     Physical Exam Updated Vital Signs BP 124/88 (BP Location: Right Arm)   Pulse 90   Temp 98.4 F (36.9 C) (Oral)   Resp 18   Ht 6\' 1"  (1.854 m)   Wt 114.8 kg   SpO2 96%   BMI 33.40 kg/m   Physical Exam Vitals and nursing note reviewed.  Constitutional:      General: He is not in acute distress.    Appearance: He is well-developed. He is not diaphoretic.  HENT:     Head: Normocephalic and atraumatic.  Eyes:     General: No scleral icterus.    Conjunctiva/sclera: Conjunctivae normal.  Cardiovascular:     Rate and Rhythm: Normal rate and regular rhythm.     Heart sounds: Normal heart sounds.  Pulmonary:     Breath sounds: Wheezing present.  Abdominal:     Palpations: Abdomen is soft.     Tenderness: There is no abdominal tenderness.  Musculoskeletal:     Cervical back: Normal range of motion and neck supple.  Skin:    General: Skin is warm and dry.  Neurological:     Mental Status: He is alert.  Psychiatric:        Behavior: Behavior normal.     ED Results / Procedures / Treatments   Labs (all labs ordered are listed, but only abnormal results are displayed) Labs Reviewed - No data to display  EKG None  Radiology No results found.  Procedures Procedures    Medications Ordered in ED Medications - No data to display  ED Course/ Medical Decision Making/ A&P                           Medical Decision Making 31 year old male who presents emergency department with a chief complaint of cough. Differential diagnosis for emergent cause of cough includes but is not limited to upper respiratory infection, lower respiratory infection, allergies, asthma, irritants, foreign body, medications such as ACE inhibitors, reflux, asthma, CHF, lung cancer, interstitial lung disease, psychiatric causes, postnasal drip and postinfectious bronchospasm.  I ordered, independently interpreted and reviewed images of a chest x-ray which shows no evidence of pneumonia.  Patient appears to have bronchitis with bronchospasm, will treat accordingly with albuterol and Medrol Dosepak.  Patient also given prescription for Tessalon for coughing.  Discussed outpatient follow-up and  return precautions.  Amount and/or Complexity of Data Reviewed Radiology: ordered and independent interpretation performed. Discussion of management or test interpretation with external provider(s): Social determinants of health: Patient is uninsured.  Risk Prescription drug management.   score, Chads2Vasc2 etc:1}      Final Clinical Impression(s) / ED Diagnoses Final diagnoses:  None    Rx / DC Orders ED Discharge Orders     None         Delos Haring 10/15/21 2011    Eber Hong, MD 10/16/21 1043

## 2023-12-02 ENCOUNTER — Other Ambulatory Visit: Payer: Self-pay

## 2023-12-02 ENCOUNTER — Emergency Department (HOSPITAL_COMMUNITY)
Admission: EM | Admit: 2023-12-02 | Discharge: 2023-12-02 | Disposition: A | Discharge status: Home / Self Care | Payer: Self-pay | Attending: Emergency Medicine | Admitting: Emergency Medicine

## 2023-12-02 DIAGNOSIS — K047 Periapical abscess without sinus: Secondary | ICD-10-CM | POA: Insufficient documentation

## 2023-12-02 MED ORDER — AMOXICILLIN 500 MG PO CAPS: 500.0000 mg | ORAL_CAPSULE | Freq: Three times a day (TID) | ORAL | 0 refills | Status: AC

## 2023-12-02 MED ORDER — NAPROXEN 500 MG PO TABS: 500.0000 mg | ORAL_TABLET | Freq: Two times a day (BID) | ORAL | 0 refills | Status: AC

## 2023-12-02 NOTE — Discharge Instructions (Signed)
 Please follow-up closely with a dentist on an outpatient basis.  Take all antibiotics as directed.  Return to emergency department immediately for any new or worsening symptoms.

## 2023-12-02 NOTE — ED Provider Notes (Signed)
 [place] EMERGENCY DEPARTMENT AT [facility] Provider Note   CSN: [id number] Arrival date & time: 12/02/23  9041     Patient presents with: Jaw Pain   Hunter Bell is a 33 y.o. male.   Patient is a 33 year old male who presents emergency department with a chief complaint of swelling along his inferior anterior gumline.  Patient notes that symptoms have been ongoing for approximate the past 2 days.  He does have a history of previous root canal  around the affected area.  He notes that he has had no associated stridor, dysphagia, drooling, dyspnea.  He denies any associated fever or chills.        Prior to Admission medications   Medication Sig Start Date End Date Taking? Authorizing Provider  amoxicillin  (AMOXIL ) 500 MG capsule Take 1 capsule (500 mg total) by mouth 3 (three) times daily for 10 days. 12/02/23 12/12/23 Yes Concettina Leth, Lonni D, PA-C  naproxen  (NAPROSYN ) 500 MG tablet Take 1 tablet (500 mg total) by mouth 2 (two) times daily. 12/02/23  Yes Daralene Lonni D, PA-C  benzonatate  (TESSALON ) 100 MG capsule Take 1 capsule (100 mg total) by mouth every 8 (eight) hours. 10/15/21   Harris, Abigail, PA-C  cyanocobalamin  1000 MCG tablet Take 1 tablet (1,000 mcg total) by mouth daily. 06/30/20   Laurence Fonda GRADE, MD  gabapentin  (NEURONTIN ) 300 MG capsule Take 1 capsule (300 mg total) by mouth at bedtime. 07/07/20 07/07/21  Leotis Bogus, MD  methylPREDNISolone  (MEDROL  DOSEPAK) 4 MG TBPK tablet Use as directed 10/15/21   Harris, Abigail, PA-C  pantoprazole  (PROTONIX ) 40 MG tablet Take 1 tablet (40 mg total) by mouth daily. 06/29/20   Laurence Fonda GRADE, MD  predniSONE  (DELTASONE ) 10 MG tablet Take 1 tablet (10 mg total) by mouth daily. Advised to take 50 mg of prednisone  for 1 day, then prednisone  40 mg for 1 day then prednisone  30 mg for 1 day then prednisone20 mg for 1 day then prednisone  10 mg for 1 day and then discontinue. 07/07/20   Leotis Bogus, MD  Vitamin D , Ergocalciferol ,  (DRISDOL ) 1.25 MG (50000 UNIT) CAPS capsule Take 1 capsule (50,000 Units total) by mouth every 7 (seven) days. 06/29/20   Laurence Fonda GRADE, MD    Allergies: Patient has no known allergies.    Review of Systems  HENT:  Positive for dental problem.   All other systems reviewed and are negative.   Updated Vital Signs BP (!) 143/92   Pulse 89   Temp 99.3 F (37.4 C) (Oral)   Resp 18   Ht 6' 1 (1.854 m)   Wt 115.7 kg   SpO2 96%   BMI 33.64 kg/m   Physical Exam Vitals and nursing note reviewed.  Constitutional:      General: He is not in acute distress.    Appearance: Normal appearance. He is not ill-appearing.  HENT:     Head: Normocephalic and atraumatic.     Nose: Nose normal.     Mouth/Throat:     Mouth: Mucous membranes are moist.     Comments: Mild swelling noted along the inferior gumline, no areas of fluctuance or induration, floor mouth is soft, tolerant secretion without difficulty, no peritonsillar swelling Eyes:     Extraocular Movements: Extraocular movements intact.     Conjunctiva/sclera: Conjunctivae normal.     Pupils: Pupils are equal, round, and reactive to light.  Cardiovascular:     Rate and Rhythm: Normal rate and regular rhythm.  Pulses: Normal pulses.     Heart sounds: Normal heart sounds.  Pulmonary:     Effort: Pulmonary effort is normal. No respiratory distress.     Breath sounds: Normal breath sounds. No stridor. No wheezing or rales.  Musculoskeletal:        General: Normal range of motion.     Cervical back: Normal range of motion and neck supple. No rigidity or tenderness.  Skin:    General: Skin is warm and dry.  Neurological:     General: No focal deficit present.     Mental Status: He is alert and oriented to person, place, and time. Mental status is at baseline.     (all labs ordered are listed, but only abnormal results are displayed) Labs Reviewed - No data to display  EKG: None  Radiology: No results found.   Procedures    Medications Ordered in the ED - No data to display                                  Medical Decision Making Patient is doing well at this time and is stable for discharge home.  Discussed with patient we will cover him for dental infection at this point.  He has no indication for an obvious drainable abscess.  He has stable vital signs with no indication for sepsis.  He has no signs of acute peritonsillar abscess, Ludwig's angina, retropharyngeal abscess, epiglottitis.  Dental resources were provided.  Strict return precautions were provided for any new or worsening symptoms.  Patient voiced understanding and had no additional questions.  He has no signs of acute airway compromise and no associated trismus.  Risk Prescription drug management.        Final diagnoses:  Dental infection    ED Discharge Orders          Ordered    amoxicillin  (AMOXIL ) 500 MG capsule  3 times daily        12/02/23 1121    naproxen  (NAPROSYN ) 500 MG tablet  2 times daily        12/02/23 1121               Daralene Lonni JONETTA DEVONNA 12/02/23 1123    Suzette Pac, MD 12/02/23 1721

## 2023-12-02 NOTE — ED Triage Notes (Signed)
 Pt arrived POV with c/o right lower jaw pain and swelling that started last night No visible abscess noted. No airway obstruction noted during assessment. Pt verbalized it is effecting his po intake.

## 2024-04-23 ENCOUNTER — Other Ambulatory Visit: Payer: Self-pay
